# Patient Record
Sex: Male | Born: 2000 | Race: White | Hispanic: No | Marital: Single | Smoking: Current every day smoker
Health system: Southern US, Community
[De-identification: ages and names within clinical notes are randomized; demographics above are authoritative.]

## PROBLEM LIST (undated history)

## (undated) DIAGNOSIS — F909 Attention-deficit hyperactivity disorder, unspecified type: Secondary | ICD-10-CM

## (undated) DIAGNOSIS — N200 Calculus of kidney: Secondary | ICD-10-CM

## (undated) DIAGNOSIS — F419 Anxiety disorder, unspecified: Secondary | ICD-10-CM

## (undated) DIAGNOSIS — N289 Disorder of kidney and ureter, unspecified: Secondary | ICD-10-CM

## (undated) HISTORY — PX: TYMPANOSTOMY TUBE PLACEMENT: SHX32

## (undated) HISTORY — DX: Anxiety disorder, unspecified: F41.9

## (undated) HISTORY — PX: ADENOIDECTOMY: SUR15

## (undated) HISTORY — DX: Calculus of kidney: N20.0

## (undated) HISTORY — PX: TONSILLECTOMY: SUR1361

---
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
## 2020-01-01 ENCOUNTER — Encounter: Payer: Self-pay | Admitting: Emergency Medicine

## 2020-01-01 ENCOUNTER — Emergency Department: Payer: Medicaid Other

## 2020-01-01 ENCOUNTER — Emergency Department
Admission: EM | Admit: 2020-01-01 | Discharge: 2020-01-01 | Disposition: A | Discharge status: Home / Self Care | Payer: Medicaid Other | Attending: Emergency Medicine | Admitting: Emergency Medicine

## 2020-01-01 ENCOUNTER — Other Ambulatory Visit: Payer: Self-pay

## 2020-01-01 DIAGNOSIS — L03032 Cellulitis of left toe: Secondary | ICD-10-CM | POA: Diagnosis not present

## 2020-01-01 DIAGNOSIS — W19XXXA Unspecified fall, initial encounter: Secondary | ICD-10-CM | POA: Diagnosis not present

## 2020-01-01 DIAGNOSIS — Z79899 Other long term (current) drug therapy: Secondary | ICD-10-CM | POA: Diagnosis not present

## 2020-01-01 DIAGNOSIS — M79672 Pain in left foot: Secondary | ICD-10-CM | POA: Insufficient documentation

## 2020-01-01 DIAGNOSIS — M25561 Pain in right knee: Secondary | ICD-10-CM | POA: Insufficient documentation

## 2020-01-01 DIAGNOSIS — Y990 Civilian activity done for income or pay: Secondary | ICD-10-CM | POA: Diagnosis not present

## 2020-01-01 DIAGNOSIS — M25562 Pain in left knee: Secondary | ICD-10-CM | POA: Diagnosis not present

## 2020-01-01 DIAGNOSIS — M7989 Other specified soft tissue disorders: Secondary | ICD-10-CM | POA: Diagnosis not present

## 2020-01-01 DIAGNOSIS — Q741 Congenital malformation of knee: Secondary | ICD-10-CM | POA: Diagnosis not present

## 2020-01-01 HISTORY — DX: Disorder of kidney and ureter, unspecified: N28.9

## 2020-01-01 MED ORDER — MELOXICAM 15 MG PO TABS: 15.0000 mg | ORAL_TABLET | Freq: Every day | ORAL | 0 refills | Status: AC

## 2020-01-01 MED ORDER — CEPHALEXIN 500 MG PO CAPS: 1000.0000 mg | ORAL_CAPSULE | Freq: Two times a day (BID) | ORAL | 0 refills | Status: AC

## 2020-01-01 NOTE — ED Notes (Signed)
Radiology at bedside

## 2020-01-01 NOTE — ED Triage Notes (Signed)
Patient states that he fell on bilateral knees at work Thursday and that he continues to have pain. Patient states that he does not want to file workers comp. Patient states that he also has an infection by his toe nail on his left first toe. Patient also complaint of pain to the top of his left foot that started yesterday.

## 2020-01-01 NOTE — ED Provider Notes (Signed)
[facility] Emergency Department Provider Note  ____________________________________________   First MD Initiated Contact with Patient 01/01/20 2148     (approximate)  I have reviewed the triage vital signs and the nursing notes.   HISTORY  Chief Complaint Knee Pain and Wound Check  HPI Jesus Mason is a 19 y.o. male who presents emergency department for evaluation of bilateral knee pain, left foot pain and left toe pain.  The patient states that on Wednesday or Thursday of last week, the patient fell while working at [employer].  He states he does not want to claim this is a workers comp injury.  He states that he was walking with a Tea urn and slipped and fell with his knees flexed directly onto the hard floor.  He states he has history of bilateral knee pain for the last several years, but states that significantly worsened since the injury.  He does not have pain unless he is weightbearing, but weightbearing makes it worse, left is worse than right.  He denies any alleviating attempts.  He also states that he injured his left foot during the injury but he is unsure how it fell.  He complains of pain to the dorsum of the foot.  In addition, the patient states he has an infection of his left toe has been present for about a week and a half.  He states that he has self treated this before but that it has returned.  He says has not tried any alleviating attempts this time for his left toe.         Past Medical History:  Diagnosis Date  . ADHD (attention deficit hyperactivity disorder)   . Renal disorder    kidney stones    There are no problems to display for this patient.   Past Surgical History:  Procedure Laterality Date  . ADENOIDECTOMY    . TONSILLECTOMY    . TYMPANOSTOMY TUBE PLACEMENT      Prior to Admission medications   Medication Sig Start Date End Date Taking? Authorizing Provider  acetaminophen-codeine (TYLENOL #3) 300-30 MG  tablet Take 1 tablet by mouth every 6 (six) hours as needed for moderate pain. 01/13/15   Tommi Rumps, PA-C  busPIRone (BUSPAR) 10 MG tablet Take 1 tablet by mouth daily.  09/03/14   [provider]  butalbital-acetaminophen-caffeine (FIORICET) 50-325-40 MG tablet Take 1 tablet by mouth every 6 (six) hours as needed for headache. 05/14/19 05/13/20  Enid Derry, PA-C  cephALEXin (KEFLEX) 500 MG capsule Take 2 capsules (1,000 mg total) by mouth 2 (two) times daily for 10 days. 01/01/20 01/11/20  Lucy Chris, PA  dexmethylphenidate (FOCALIN XR) 15 MG 24 hr capsule Take 15 mg by mouth daily.    [provider]  dexmethylphenidate (FOCALIN) 10 MG tablet Take 10 mg by mouth daily.    [provider]  dexmethylphenidate (FOCALIN) 5 MG tablet Take 5 mg by mouth daily.    [provider]  diazepam (VALIUM) 2 MG tablet Take 1 tablet (2 mg total) by mouth every 8 (eight) hours as needed for muscle spasms. 01/13/15   Tommi Rumps, PA-C  HYDROcodone-acetaminophen (NORCO/VICODIN) 5-325 MG tablet Take 1 tablet by mouth every 6 (six) hours as needed for moderate pain. 09/29/19   Fisher, Roselyn Bering, PA-C  ketorolac (TORADOL) 10 MG tablet Take 1 tablet (10 mg total) by mouth every 6 (six) hours as needed. 09/29/19   Sherrie Mustache Roselyn Bering, PA-C  meloxicam [facility])  15 MG tablet Take 1 tablet (15 mg total) by mouth daily for 15 days. 01/01/20 01/16/20  Lucy Chrisodgers, Desare Duddy J, PA  ondansetron (ZOFRAN ODT) 4 MG disintegrating tablet Take 1 tablet (4 mg total) by mouth every 6 (six) hours as needed for nausea or vomiting. 09/30/14   Sharyn CreamerQuale, Mark, MD  traZODone (DESYREL) 100 MG tablet Take 100 mg by mouth at bedtime as needed for sleep.    [provider]    Allergies Patient has no known allergies.  No family history on file.  Social History Social History   Tobacco Use  . Smoking status: Never Smoker  . Smokeless tobacco: Never Used  Substance Use Topics  . Alcohol use: No   . Drug use: No    Review of Systems Constitutional: No fever/chills Eyes: No visual changes. ENT: No sore throat. Cardiovascular: Denies chest pain. Respiratory: Denies shortness of breath. Gastrointestinal: No abdominal pain.  No nausea, no vomiting.  No diarrhea.  No constipation. Genitourinary: Negative for dysuria. Musculoskeletal: + Bilateral knee pain, left foot pain, left great toe pain Skin: Negative for rash. Neurological: Negative for headaches, focal weakness or numbness.   ____________________________________________   PHYSICAL EXAM:  VITAL SIGNS: ED Triage Vitals [01/01/20 2125]  Enc Vitals Group     BP (!) 147/86     Pulse Rate 97     Resp 18     Temp 98.7 F (37.1 C)     Temp Source Oral     SpO2 97 %     Weight 210 lb (95.3 kg)     Height 5\' 8"  (1.727 m)     Head Circumference      Peak Flow      Pain Score 0     Pain Loc      Pain Edu?      Excl. in GC?     Constitutional: Alert and oriented. Well appearing and in no acute distress. Eyes: Conjunctivae are normal.  EOMI. Head: Atraumatic. Nose: No congestion/rhinnorhea. Mouth/Throat: Mucous membranes are moist.   Neck: No stridor.   Cardiovascular: Normal rate, regular rhythm.  Good peripheral circulation. Respiratory: Normal respiratory effort.  No retractions.  Musculoskeletal: There is no tenderness to palpation of the bilateral knees.  Patient has full and equal range of motion, and also is able to get to full extension.  Ligamentous exam is intact for LCL, MCL PCL bilaterally.  Negative McMurray bilaterally.  Patient has full range of motion of the left ankle, minimal tenderness to palpation of the dorsum of the left foot.  Dorsalis pedis 2+, capillary refill less than 3 seconds.  The patient does have a soft tissue swelling about the lateral nail bed on the left great toe with erythema and scabbing from site of previous drainage. Neurologic:  Normal speech and language. No gross focal  neurologic deficits are appreciated. No gait instability. Skin:  Skin is warm, dry and intact except as described above.  Psychiatric: Mood and affect are normal. Speech and behavior are normal.  ____________________________________________  RADIOLOGY I, Lucy Chrisaitlin J Taylynn Easton, personally viewed and evaluated these images (plain radiographs) as part of my medical decision making, as well as reviewing the written report by the radiologist.  ED provider interpretation: No acute fractures of the bilateral knees or left foot  Official radiology report(s): DG Knee Complete 4 Views Left  Result Date: 01/01/2020 CLINICAL DATA:  Fall to bilateral knees EXAM: LEFT KNEE - COMPLETE 4+ VIEW; RIGHT KNEE - COMPLETE 4+ VIEW COMPARISON:  Radiograph 11/19/2016 FINDINGS: Right knee: Minimal anterior soft tissue swelling. No sizable knee joint effusion. No acute bony abnormality. Specifically, no fracture, subluxation, or dislocation. Left knee: Corticated appearance of a bipartite patella. Mild soft tissue swelling anteriorly. No acute bony abnormality. Specifically, no fracture, subluxation, or dislocation. No sizeable joint effusion. IMPRESSION: 1. Minimal anterior soft tissue swelling bilaterally. No acute fracture or traumatic malalignment. No sizeable effusions. 2. Bipartite left patella. Electronically Signed   By: Kreg Shropshire M.D.   On: 01/01/2020 21:53   DG Knee Complete 4 Views Right  Result Date: 01/01/2020 CLINICAL DATA:  Fall to bilateral knees EXAM: LEFT KNEE - COMPLETE 4+ VIEW; RIGHT KNEE - COMPLETE 4+ VIEW COMPARISON:  Radiograph 11/19/2016 FINDINGS: Right knee: Minimal anterior soft tissue swelling. No sizable knee joint effusion. No acute bony abnormality. Specifically, no fracture, subluxation, or dislocation. Left knee: Corticated appearance of a bipartite patella. Mild soft tissue swelling anteriorly. No acute bony abnormality. Specifically, no fracture, subluxation, or dislocation. No sizeable joint  effusion. IMPRESSION: 1. Minimal anterior soft tissue swelling bilaterally. No acute fracture or traumatic malalignment. No sizeable effusions. 2. Bipartite left patella. Electronically Signed   By: Kreg Shropshire M.D.   On: 01/01/2020 21:53   DG Foot Complete Left  Result Date: 01/01/2020 CLINICAL DATA:  Left foot pain after fall EXAM: LEFT FOOT - COMPLETE 3+ VIEW COMPARISON:  None. FINDINGS: There is no evidence of fracture or dislocation. There is no evidence of arthropathy or other focal bone abnormality. Mild dorsal soft tissue swelling. IMPRESSION: Negative. Electronically Signed   By: Jonna Clark M.D.   On: 01/01/2020 22:26    ____________________________________________   INITIAL IMPRESSION / ASSESSMENT AND PLAN / ED COURSE  As part of my medical decision making, I reviewed the following data within the electronic MEDICAL RECORD NUMBER Nursing notes reviewed and incorporated and Radiograph reviewed         Patient is a 19 year old male who presents emergency department for evaluation of bilateral knee pain, left foot pain and left great toe pain. Physical exam is grossly benign and does not demonstrate any instability of either knee. X-rays are negative for any acute fractures of the bilateral knees or left foot. Exam of the left great toe is consistent with paronychia, however there is already a site where it has tried to drain on its own. It does not appear to have any collected pus at the site currently, this is consistent with paronychia for the left great toe. We will place the patient on anti-inflammatories for the bilateral knee pain and left foot pain due to recent fall and will prescribe patient Keflex for his paronychia. We will have the patient follow-up with primary care for his multiple complaints however did provide information for podiatry should he want his ingrown nail taking care of. Patient is stable at this time for outpatient therapy and will return to the emergency department  with any acute worsening.      ____________________________________________   FINAL CLINICAL IMPRESSION(S) / ED DIAGNOSES  Final diagnoses:  Acute pain of both knees  Left foot pain  Paronychia of great toe of left foot     ED Discharge Orders         Ordered    cephALEXin (KEFLEX) 500 MG capsule  2 times daily        01/01/20 2239    meloxicam (MOBIC) 15 MG tablet  Daily        01/01/20 2239          *  Please note:  Jesus Mason was evaluated in Emergency Department on 01/01/2020 for the symptoms described in the history of present illness. He was evaluated in the context of the global COVID-19 pandemic, which necessitated consideration that the patient might be at risk for infection with the SARS-CoV-2 virus that causes COVID-19. Institutional protocols and algorithms that pertain to the evaluation of patients at risk for COVID-19 are in a state of rapid change based on information released by regulatory bodies including the CDC and federal and state organizations. These policies and algorithms were followed during the patient's care in the ED.  Some ED evaluations and interventions may be delayed as a result of limited staffing during and the pandemic.*   Note:  This document was prepared using Dragon voice recognition software and may include unintentional dictation errors.    Lucy Chris, PA 01/01/20 Clinton Sawyer    Shaune Pollack, MD 01/02/20 1248

## 2020-01-29 DIAGNOSIS — Z419 Encounter for procedure for purposes other than remedying health state, unspecified: Secondary | ICD-10-CM | POA: Diagnosis not present

## 2020-02-17 ENCOUNTER — Other Ambulatory Visit: Payer: Medicaid Other

## 2020-02-17 DIAGNOSIS — Z20822 Contact with and (suspected) exposure to covid-19: Secondary | ICD-10-CM | POA: Diagnosis not present

## 2020-02-18 LAB — NOVEL CORONAVIRUS, NAA: SARS-CoV-2, NAA: NOT DETECTED

## 2020-02-18 LAB — SARS-COV-2, NAA 2 DAY TAT

## 2020-02-21 ENCOUNTER — Other Ambulatory Visit: Payer: Medicaid Other

## 2020-02-21 DIAGNOSIS — Z20822 Contact with and (suspected) exposure to covid-19: Secondary | ICD-10-CM

## 2020-02-22 LAB — NOVEL CORONAVIRUS, NAA: SARS-CoV-2, NAA: DETECTED — AB

## 2020-02-22 LAB — SARS-COV-2, NAA 2 DAY TAT

## 2020-02-29 DIAGNOSIS — Z419 Encounter for procedure for purposes other than remedying health state, unspecified: Secondary | ICD-10-CM | POA: Diagnosis not present

## 2020-03-25 ENCOUNTER — Emergency Department: Payer: Worker's Compensation

## 2020-03-25 ENCOUNTER — Other Ambulatory Visit: Payer: Self-pay

## 2020-03-25 ENCOUNTER — Emergency Department
Admission: EM | Admit: 2020-03-25 | Discharge: 2020-03-25 | Disposition: A | Discharge status: Home / Self Care | Payer: Worker's Compensation | Attending: Emergency Medicine | Admitting: Emergency Medicine

## 2020-03-25 DIAGNOSIS — Y990 Civilian activity done for income or pay: Secondary | ICD-10-CM | POA: Insufficient documentation

## 2020-03-25 DIAGNOSIS — S93492A Sprain of other ligament of left ankle, initial encounter: Secondary | ICD-10-CM | POA: Insufficient documentation

## 2020-03-25 DIAGNOSIS — W010XXA Fall on same level from slipping, tripping and stumbling without subsequent striking against object, initial encounter: Secondary | ICD-10-CM | POA: Insufficient documentation

## 2020-03-25 DIAGNOSIS — S99912A Unspecified injury of left ankle, initial encounter: Secondary | ICD-10-CM | POA: Diagnosis present

## 2020-03-25 MED ORDER — MELOXICAM 7.5 MG PO TABS
15.0000 mg | ORAL_TABLET | Freq: Once | ORAL | Status: AC
  Administered 2020-03-25: 15 mg via ORAL
  Filled 2020-03-25: qty 2

## 2020-03-25 MED ORDER — MELOXICAM 15 MG PO TABS: 15.0000 mg | ORAL_TABLET | Freq: Every day | ORAL | 0 refills | Status: AC

## 2020-03-25 NOTE — ED Notes (Signed)
Workers Comp unable to be completed due to Pt not having ID.

## 2020-03-25 NOTE — ED Triage Notes (Addendum)
Pt presents the ED via POV from work. Pt states he had a slip and trip on some rock while he was working at [employer]. Pt states that he only stumbled and did fall all the way. Pt c/o L ankle pain. Pt is A&Ox4 and NAD.   Pt is a WC patient.

## 2020-03-25 NOTE — ED Notes (Signed)
Pt's mother to pick up pt in an hour due to mother being at work. Pt wheeled to lobby to wait

## 2020-03-26 NOTE — ED Provider Notes (Signed)
[facility] Emergency Department Provider Note  ____________________________________________   Event Date/Time   First MD Initiated Contact with Patient 03/25/20 1702     (approximate)  I have reviewed the triage vital signs and the nursing notes.   HISTORY  Chief Complaint Ankle Pain  HPI Jesus Mason is a 20 y.o. male who presents to the emergency department for evaluation of left ankle pain.  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  He denies hearing a pop, has been able to ambulate on it since that time.  Pain is mostly located in the lateral soft tissue region.  He has not tried any alleviating measures at this time.         Past Medical History:  Diagnosis Date  . ADHD (attention deficit hyperactivity disorder)   . Renal disorder    kidney stones    There are no problems to display for this patient.   Past Surgical History:  Procedure Laterality Date  . ADENOIDECTOMY    . TONSILLECTOMY    . TYMPANOSTOMY TUBE PLACEMENT      Prior to Admission medications   Medication Sig Start Date End Date Taking? Authorizing Provider  meloxicam (MOBIC) 15 MG tablet Take 1 tablet (15 mg total) by mouth daily for 15 days. 03/25/20 04/09/20 Yes Rodgers, Ruben Gottron, PA  acetaminophen-codeine (TYLENOL #3) 300-30 MG tablet Take 1 tablet by mouth every 6 (six) hours as needed for moderate pain. 01/13/15   Tommi Rumps, PA-C  busPIRone (BUSPAR) 10 MG tablet Take 1 tablet by mouth daily.  09/03/14   [provider]  butalbital-acetaminophen-caffeine (FIORICET) 50-325-40 MG tablet Take 1 tablet by mouth every 6 (six) hours as needed for headache. 05/14/19 05/13/20  Enid Derry, PA-C  dexmethylphenidate (FOCALIN XR) 15 MG 24 hr capsule Take 15 mg by mouth daily.    [provider]  dexmethylphenidate (FOCALIN) 10 MG  tablet Take 10 mg by mouth daily.    [provider]  dexmethylphenidate (FOCALIN) 5 MG tablet Take 5 mg by mouth daily.    [provider]  diazepam (VALIUM) 2 MG tablet Take 1 tablet (2 mg total) by mouth every 8 (eight) hours as needed for muscle spasms. 01/13/15   Tommi Rumps, PA-C  HYDROcodone-acetaminophen (NORCO/VICODIN) 5-325 MG tablet Take 1 tablet by mouth every 6 (six) hours as needed for moderate pain. 09/29/19   Fisher, Roselyn Bering, PA-C  ketorolac (TORADOL) 10 MG tablet Take 1 tablet (10 mg total) by mouth every 6 (six) hours as needed. 09/29/19   Fisher, Roselyn Bering, PA-C  ondansetron (ZOFRAN ODT) 4 MG disintegrating tablet Take 1 tablet (4 mg total) by mouth every 6 (six) hours as needed for nausea or vomiting. 09/30/14   Sharyn Creamer, MD  traZODone (DESYREL) 100 MG tablet Take 100 mg by mouth at bedtime as needed for sleep.    [provider]    Allergies Patient has no known allergies.  No family history on file.  Social History Social History   Tobacco Use  . Smoking status: Never Smoker  . Smokeless tobacco: Never Used  Substance Use Topics  . Alcohol use: No  . Drug use: No    Review of Systems Constitutional: No fever/chills Eyes: No visual changes. ENT: No sore throat. Cardiovascular: Denies chest pain. Respiratory: Denies shortness of breath. Gastrointestinal: No abdominal  pain.  No nausea, no vomiting.  No diarrhea.  No constipation. Genitourinary: Negative for dysuria. Musculoskeletal: + Left ankle pain, negative for back pain. Skin: Negative for rash. Neurological: Negative for headaches, focal weakness or numbness.   ____________________________________________   PHYSICAL EXAM:  VITAL SIGNS: ED Triage Vitals  Enc Vitals Group     BP 03/25/20 1543 (!) 156/86     Pulse Rate 03/25/20 1543 91     Resp 03/25/20 1543 18     Temp 03/25/20 1543 97.7 F (36.5 C)     Temp Source 03/25/20 1543 Oral     SpO2 03/25/20 1543 100 %      Weight 03/25/20 1536 205 lb (93 kg)     Height 03/25/20 1536 5\' 8"  (1.727 m)     Head Circumference --      Peak Flow --      Pain Score 03/25/20 1536 7     Pain Loc --      Pain Edu? --      Excl. in GC? --    Constitutional: Alert and oriented. Well appearing and in no acute distress. Eyes: Conjunctivae are normal. PERRL. EOMI. Head: Atraumatic. Nose: No congestion/rhinnorhea. Mouth/Throat: Mucous membranes are moist.   Neck: No stridor.   Musculoskeletal: There is tenderness noted to the lateral aspect of the left ankle.  No bony tenderness of the distal fibula or proximal fifth metatarsal.  There is no ecchymosis or significant soft tissue swelling.  Dorsal pedal pulse 2+, capillary refill less than 3 seconds.  Patient able to move ankle in all directions, with mild pain at end range.  Maintains 5/5 strength in plantarflexion and dorsiflexion. Neurologic:  Normal speech and language. No gross focal neurologic deficits are appreciated. No gait instability. Skin:  Skin is warm, dry and intact. No rash noted. Psychiatric: Mood and affect are normal. Speech and behavior are normal.   ____________________________________________  RADIOLOGY I, 03/27/20, personally viewed and evaluated these images (plain radiographs) as part of my medical decision making, as well as reviewing the written report by the radiologist.  ED provider interpretation: No acute fracture identified  Official radiology report(s): DG Ankle Complete Left  Result Date: 03/25/2020 CLINICAL DATA:  Ankle pain EXAM: LEFT ANKLE COMPLETE - 3+ VIEW COMPARISON:  January 01, 2020 FINDINGS: No acute fracture or dislocation. Joint spaces and alignment are maintained. No area of erosion or osseous destruction. No unexpected radiopaque foreign body. Soft tissues are unremarkable. IMPRESSION: No acute fracture or dislocation. Electronically Signed   By: January 03, 2020 MD   On: 03/25/2020 16:14     ____________________________________________   INITIAL IMPRESSION / ASSESSMENT AND PLAN / ED COURSE  As part of my medical decision making, I reviewed the following data within the electronic MEDICAL RECORD NUMBER Nursing notes reviewed and incorporated, Radiograph reviewed and Notes from prior ED visits        Patient is a 20 year old male who presents to the emergency department for evaluation of left ankle pain.  See HPI for further details.  In triage, the patient has normal vital signs.  On physical exam, there is tenderness over the lateral soft tissue structures, no bony tenderness of the distal fibula or proximal fifth metatarsal.  Patient is neurovascularly intact and maintains a 5/5 strength in plantar flexion dorsiflexion.  Patient's history and physical exam most consistent with mild ankle sprain.  Will initiate treatment with ASO brace, anti-inflammatories and follow-up with orthopedics.  Patient is amenable with this plan  and stable at this time for outpatient follow-up.      ____________________________________________   FINAL CLINICAL IMPRESSION(S) / ED DIAGNOSES  Final diagnoses:  Sprain of other ligament of left ankle, initial encounter     ED Discharge Orders         Ordered    meloxicam (MOBIC) 15 MG tablet  Daily        03/25/20 1823          *Please note:  Jesus Mason was evaluated in Emergency Department on 03/26/2020 for the symptoms described in the history of present illness. He was evaluated in the context of the global COVID-19 pandemic, which necessitated consideration that the patient might be at risk for infection with the SARS-CoV-2 virus that causes COVID-19. Institutional protocols and algorithms that pertain to the evaluation of patients at risk for COVID-19 are in a state of rapid change based on information released by regulatory bodies including the CDC and federal and state organizations. These policies and algorithms were followed  during the patient's care in the ED.  Some ED evaluations and interventions may be delayed as a result of limited staffing during and the pandemic.*   Note:  This document was prepared using Dragon voice recognition software and may include unintentional dictation errors.   Lucy Chris, PA 03/26/20 1404    Phineas Semen, MD 03/26/20 [phone]

## 2020-03-28 DIAGNOSIS — Z419 Encounter for procedure for purposes other than remedying health state, unspecified: Secondary | ICD-10-CM | POA: Diagnosis not present

## 2020-04-28 DIAGNOSIS — Z419 Encounter for procedure for purposes other than remedying health state, unspecified: Secondary | ICD-10-CM | POA: Diagnosis not present

## 2020-05-28 DIAGNOSIS — Z419 Encounter for procedure for purposes other than remedying health state, unspecified: Secondary | ICD-10-CM | POA: Diagnosis not present

## 2020-06-28 DIAGNOSIS — Z419 Encounter for procedure for purposes other than remedying health state, unspecified: Secondary | ICD-10-CM | POA: Diagnosis not present

## 2020-07-28 DIAGNOSIS — Z419 Encounter for procedure for purposes other than remedying health state, unspecified: Secondary | ICD-10-CM | POA: Diagnosis not present

## 2020-08-28 DIAGNOSIS — Z419 Encounter for procedure for purposes other than remedying health state, unspecified: Secondary | ICD-10-CM | POA: Diagnosis not present

## 2020-09-20 ENCOUNTER — Emergency Department: Payer: No Typology Code available for payment source

## 2020-09-20 ENCOUNTER — Emergency Department
Admission: EM | Admit: 2020-09-20 | Discharge: 2020-09-21 | Disposition: A | Discharge status: Home / Self Care | Payer: No Typology Code available for payment source | Attending: Emergency Medicine | Admitting: Emergency Medicine

## 2020-09-20 ENCOUNTER — Other Ambulatory Visit: Payer: Self-pay

## 2020-09-20 ENCOUNTER — Encounter: Payer: Self-pay | Admitting: Emergency Medicine

## 2020-09-20 DIAGNOSIS — S300XXA Contusion of lower back and pelvis, initial encounter: Secondary | ICD-10-CM | POA: Insufficient documentation

## 2020-09-20 DIAGNOSIS — W010XXA Fall on same level from slipping, tripping and stumbling without subsequent striking against object, initial encounter: Secondary | ICD-10-CM | POA: Diagnosis not present

## 2020-09-20 DIAGNOSIS — Y990 Civilian activity done for income or pay: Secondary | ICD-10-CM | POA: Diagnosis not present

## 2020-09-20 DIAGNOSIS — W19XXXA Unspecified fall, initial encounter: Secondary | ICD-10-CM

## 2020-09-20 DIAGNOSIS — S39012A Strain of muscle, fascia and tendon of lower back, initial encounter: Secondary | ICD-10-CM | POA: Insufficient documentation

## 2020-09-20 DIAGNOSIS — S3992XA Unspecified injury of lower back, initial encounter: Secondary | ICD-10-CM | POA: Diagnosis present

## 2020-09-20 DIAGNOSIS — M545 Low back pain, unspecified: Secondary | ICD-10-CM | POA: Diagnosis not present

## 2020-09-20 DIAGNOSIS — M533 Sacrococcygeal disorders, not elsewhere classified: Secondary | ICD-10-CM | POA: Diagnosis not present

## 2020-09-20 DIAGNOSIS — Y9289 Other specified places as the place of occurrence of the external cause: Secondary | ICD-10-CM | POA: Diagnosis not present

## 2020-09-20 MED ORDER — ORPHENADRINE CITRATE 30 MG/ML IJ SOLN
60.0000 mg | Freq: Once | INTRAMUSCULAR | Status: AC
  Administered 2020-09-20: 60 mg via INTRAMUSCULAR
  Filled 2020-09-20: qty 2

## 2020-09-20 MED ORDER — METHOCARBAMOL 500 MG PO TABS: 500.0000 mg | ORAL_TABLET | Freq: Four times a day (QID) | ORAL | 0 refills | Status: DC

## 2020-09-20 MED ORDER — MELOXICAM 15 MG PO TABS: 15.0000 mg | ORAL_TABLET | Freq: Every day | ORAL | 0 refills | Status: DC

## 2020-09-20 MED ORDER — KETOROLAC TROMETHAMINE 30 MG/ML IJ SOLN
30.0000 mg | Freq: Once | INTRAMUSCULAR | Status: AC
  Administered 2020-09-20: 30 mg via INTRAMUSCULAR
  Filled 2020-09-20: qty 1

## 2020-09-20 NOTE — ED Provider Notes (Signed)
[facility] Emergency Department Provider Note  ____________________________________________  Time seen: Approximately 11:30 PM  I have reviewed the triage vital signs and the nursing notes.   HISTORY  Chief Complaint Fall    HPI Jesus Mason is a 20 y.o. male who presents the emergency department complaining of low back and tailbone pain after slipping and falling at work.  Patient fell backwards landing on his back and buttocks area.  He states that he hit his head but did not lose consciousness and has had no headaches or concerning symptoms such as vision changes or neck pain.  Patient states that initially he had some numbness and tingling running down his legs directly after the incident but this has resolved.  No bowel or bladder dysfunction, saddle anesthesia or paresthesias.  No history of previous back issues.  Patient is primarily concerned given the ongoing coccyx pain       Past Medical History:  Diagnosis Date   ADHD (attention deficit hyperactivity disorder)    Renal disorder    kidney stones    There are no problems to display for this patient.   Past Surgical History:  Procedure Laterality Date   ADENOIDECTOMY     TONSILLECTOMY     TYMPANOSTOMY TUBE PLACEMENT      Prior to Admission medications   Medication Sig Start Date End Date Taking? Authorizing Provider  acetaminophen-codeine (TYLENOL #3) 300-30 MG tablet Take 1 tablet by mouth every 6 (six) hours as needed for moderate pain. 01/13/15   Tommi Rumps, PA-C  busPIRone (BUSPAR) 10 MG tablet Take 1 tablet by mouth daily.  09/03/14   [provider]  dexmethylphenidate (FOCALIN XR) 15 MG 24 hr capsule Take 15 mg by mouth daily.    [provider]  dexmethylphenidate (FOCALIN) 10 MG tablet Take 10 mg by mouth daily.    [provider]  dexmethylphenidate (FOCALIN) 5 MG tablet Take 5 mg by mouth daily.    [provider]  diazepam  (VALIUM) 2 MG tablet Take 1 tablet (2 mg total) by mouth every 8 (eight) hours as needed for muscle spasms. 01/13/15   Tommi Rumps, PA-C  HYDROcodone-acetaminophen (NORCO/VICODIN) 5-325 MG tablet Take 1 tablet by mouth every 6 (six) hours as needed for moderate pain. 09/29/19   Fisher, Roselyn Bering, PA-C  ketorolac (TORADOL) 10 MG tablet Take 1 tablet (10 mg total) by mouth every 6 (six) hours as needed. 09/29/19   Fisher, Roselyn Bering, PA-C  ondansetron (ZOFRAN ODT) 4 MG disintegrating tablet Take 1 tablet (4 mg total) by mouth every 6 (six) hours as needed for nausea or vomiting. 09/30/14   Sharyn Creamer, MD  traZODone (DESYREL) 100 MG tablet Take 100 mg by mouth at bedtime as needed for sleep.    [provider]    Allergies Patient has no known allergies.  No family history on file.  Social History Social History   Tobacco Use   Smoking status: Never   Smokeless tobacco: Never  Vaping Use   Vaping Use: Never used  Substance Use Topics   Alcohol use: No   Drug use: No     Review of Systems  Constitutional: No fever/chills Eyes: No visual changes. No discharge ENT: No upper respiratory complaints. Cardiovascular: no chest pain. Respiratory: no cough. No SOB. Gastrointestinal: No abdominal pain.  No nausea, no vomiting.  No diarrhea.  No constipation. Genitourinary: Negative for dysuria. No hematuria Musculoskeletal: Positive for lower back and tailbone pain  Skin: Negative for rash, abrasions, lacerations, ecchymosis. Neurological: Negative for headaches, focal weakness or numbness.  10 System ROS otherwise negative.  ____________________________________________   PHYSICAL EXAM:  VITAL SIGNS: ED Triage Vitals  Enc Vitals Group     BP 09/20/20 2117 (!) 133/93     Pulse Rate 09/20/20 2117 98     Resp 09/20/20 2117 15     Temp 09/20/20 2117 98.5 F (36.9 C)     Temp Source 09/20/20 2117 Oral     SpO2 09/20/20 2117 98 %     Weight 09/20/20 2119 220 lb (99.8 kg)      Height 09/20/20 2119 5\' 8"  (1.727 m)     Head Circumference --      Peak Flow --      Pain Score 09/20/20 2118 3     Pain Loc --      Pain Edu? --      Excl. in GC? --      Constitutional: Alert and oriented. Well appearing and in no acute distress. Eyes: Conjunctivae are normal. PERRL. EOMI. Head: Atraumatic. ENT:      Ears:       Nose: No congestion/rhinnorhea.      Mouth/Throat: Mucous membranes are moist.  Neck: No stridor.  No cervical spine tenderness to palpation.  Cardiovascular: Normal rate, regular rhythm. Normal S1 and S2.  Good peripheral circulation. Respiratory: Normal respiratory effort without tachypnea or retractions. Lungs CTAB. Good air entry to the bases with no decreased or absent breath sounds. Musculoskeletal: Full range of motion to all extremities. No gross deformities appreciated.  Visualization of the lower back and tailbone region revealed no obvious signs of trauma.  Patient has good range of motion at this time.  Tender through the lower back extending into the sacral and coccygeal region.  No palpable abnormalities or step-off.  No tenderness over bilateral sciatic notches.  Negative straight leg raise bilaterally.  Dorsalis pedis pulses sensation intact ankle bilateral lower extremities. Neurologic:  Normal speech and language. No gross focal neurologic deficits are appreciated.  Skin:  Skin is warm, dry and intact. No rash noted. Psychiatric: Mood and affect are normal. Speech and behavior are normal. Patient exhibits appropriate insight and judgement.   ____________________________________________   LABS (all labs ordered are listed, but only abnormal results are displayed)  Labs Reviewed - No data to display ____________________________________________  EKG   ____________________________________________  RADIOLOGY I personally viewed and evaluated these images as part of my medical decision making, as well as reviewing the written report by  the radiologist.  ED Provider Interpretation: No evidence of fracture on lumbar or coccyx films  DG Lumbar Spine 2-3 Views  Result Date: 09/20/2020 CLINICAL DATA:  09/22/2020, lower back and coccygeal pain EXAM: SACRUM AND COCCYX - 2+ VIEW; LUMBAR SPINE - 2-3 VIEW COMPARISON:  None. FINDINGS: Lumbar spine: Frontal and lateral views demonstrate 5 non-rib-bearing lumbar type vertebral bodies in anatomic alignment. No acute fractures. Disc spaces are well preserved. Sacroiliac joints are normal. Sacrum and coccyx: Frontal and lateral views of the sacrum and coccyx are obtained. No acute displaced fractures. Alignment is anatomic. Visualized portions of the bony pelvis are normal. IMPRESSION: 1. Unremarkable lumbar spine, sacrum, and coccyx. Electronically Signed   By: Larey Seat M.D.   On: 09/20/2020 22:19   DG Sacrum/Coccyx  Result Date: 09/20/2020 CLINICAL DATA:  09/22/2020, lower back and coccygeal pain EXAM: SACRUM AND COCCYX - 2+ VIEW; LUMBAR SPINE - 2-3 VIEW COMPARISON:  None.  FINDINGS: Lumbar spine: Frontal and lateral views demonstrate 5 non-rib-bearing lumbar type vertebral bodies in anatomic alignment. No acute fractures. Disc spaces are well preserved. Sacroiliac joints are normal. Sacrum and coccyx: Frontal and lateral views of the sacrum and coccyx are obtained. No acute displaced fractures. Alignment is anatomic. Visualized portions of the bony pelvis are normal. IMPRESSION: 1. Unremarkable lumbar spine, sacrum, and coccyx. Electronically Signed   By: Sharlet Salina M.D.   On: 09/20/2020 22:19    ____________________________________________    PROCEDURES  Procedure(s) performed:    Procedures    Medications - No data to display   ____________________________________________   INITIAL IMPRESSION / ASSESSMENT AND PLAN / ED COURSE  Pertinent labs & imaging results that were available during my care of the patient were reviewed by me and considered in my medical decision making (see  chart for details).  Review of the [place] CSRS was performed in accordance of the [facility] prior to dispensing any controlled drugs.           Patient's diagnosis is consistent with contusion of the coccyx with lumbar paraspinal muscle spasms.  Patient presents the emergency department after slipping and falling at work.  He landed on his buttocks and back.  This occurred 2 days ago but he was complaining of ongoing tailbone pain.  Imaging is reassuring with no evidence of fracture.  Patient is neurologically intact.  He did hit his head at the time but did not lose consciousness and has had no concerning symptoms such as headache or vision changes since.  No neck pain.  Imaging was concentrated to the lower back and coccyx which again reveals no acute traumatic findings.  Patient will have symptom control medication.  Follow-up with primary care as needed. Patient is given ED precautions to return to the ED for any worsening or new symptoms.     ____________________________________________  FINAL CLINICAL IMPRESSION(S) / ED DIAGNOSES  Final diagnoses:  Fall, initial encounter  Contusion of coccyx, initial encounter  Lumbosacral strain, initial encounter      NEW MEDICATIONS STARTED DURING THIS VISIT:  ED Discharge Orders     None           This chart was dictated using voice recognition software/Dragon. Despite best efforts to proofread, errors can occur which can change the meaning. Any change was purely unintentional.    Racheal Patches, PA-C 09/20/20 2337    Georga Hacking, MD 09/21/20 [phone]

## 2020-09-20 NOTE — ED Triage Notes (Signed)
Pt in via POV, reports falling at work on Monday, landing hard on buttocks.  Since with pain to tailbone and lower back.  Ambulatory to triage, NAD noted at this time.  [facility] profile printed and placed in chart.

## 2020-09-28 DIAGNOSIS — Z419 Encounter for procedure for purposes other than remedying health state, unspecified: Secondary | ICD-10-CM | POA: Diagnosis not present

## 2020-10-28 DIAGNOSIS — Z419 Encounter for procedure for purposes other than remedying health state, unspecified: Secondary | ICD-10-CM | POA: Diagnosis not present

## 2020-11-28 DIAGNOSIS — Z419 Encounter for procedure for purposes other than remedying health state, unspecified: Secondary | ICD-10-CM | POA: Diagnosis not present

## 2020-12-28 DIAGNOSIS — Z419 Encounter for procedure for purposes other than remedying health state, unspecified: Secondary | ICD-10-CM | POA: Diagnosis not present

## 2021-01-28 DIAGNOSIS — Z419 Encounter for procedure for purposes other than remedying health state, unspecified: Secondary | ICD-10-CM | POA: Diagnosis not present

## 2021-02-10 ENCOUNTER — Encounter: Payer: Self-pay | Admitting: Emergency Medicine

## 2021-02-10 ENCOUNTER — Emergency Department
Admission: EM | Admit: 2021-02-10 | Discharge: 2021-02-11 | Disposition: A | Discharge status: Home / Self Care | Payer: Medicaid Other | Attending: Emergency Medicine | Admitting: Emergency Medicine

## 2021-02-10 ENCOUNTER — Other Ambulatory Visit: Payer: Self-pay

## 2021-02-10 DIAGNOSIS — R519 Headache, unspecified: Secondary | ICD-10-CM | POA: Diagnosis present

## 2021-02-10 DIAGNOSIS — Z79899 Other long term (current) drug therapy: Secondary | ICD-10-CM | POA: Diagnosis not present

## 2021-02-10 DIAGNOSIS — G43909 Migraine, unspecified, not intractable, without status migrainosus: Secondary | ICD-10-CM | POA: Diagnosis not present

## 2021-02-10 MED ORDER — SODIUM CHLORIDE 0.9 % IV BOLUS
1000.0000 mL | Freq: Once | INTRAVENOUS | Status: AC
  Administered 2021-02-10: 1000 mL via INTRAVENOUS

## 2021-02-10 MED ORDER — DIPHENHYDRAMINE HCL 50 MG/ML IJ SOLN
50.0000 mg | Freq: Once | INTRAMUSCULAR | Status: AC
  Administered 2021-02-10: 50 mg via INTRAVENOUS
  Filled 2021-02-10: qty 1

## 2021-02-10 MED ORDER — METOCLOPRAMIDE HCL 5 MG/ML IJ SOLN
10.0000 mg | Freq: Once | INTRAMUSCULAR | Status: AC
  Administered 2021-02-10: 10 mg via INTRAVENOUS
  Filled 2021-02-10: qty 2

## 2021-02-10 MED ORDER — KETOROLAC TROMETHAMINE 30 MG/ML IJ SOLN
30.0000 mg | Freq: Once | INTRAMUSCULAR | Status: AC
  Administered 2021-02-10: 30 mg via INTRAVENOUS
  Filled 2021-02-10: qty 1

## 2021-02-10 NOTE — Discharge Instructions (Signed)
You have been seen today in the emergency room for migraine headache.  You were treated with Reglan, Toradol, and Benadryl.  Some of these medications can cause you to be sleepy.  Please be mindful of this for the next 6 to 8 hours.  Please try to stay hydrated as this can help you to remain headache free.  If you start to have a headache again please take your normal over-the-counter medications for migraine treatment.  You may return to the emergency room if needed or to a [place] [facility] urgent care.

## 2021-02-10 NOTE — ED Provider Notes (Signed)
[facility] Emergency Department Provider Note   ____________________________________________   Event Date/Time   First MD Initiated Contact with Patient 02/10/21 2136     (approximate)  I have reviewed the triage vital signs and the nursing notes.   HISTORY  Chief Complaint Migraine    HPI Jesus Mason is a 21 y.o. male patient presents to the emergency room with complaint of "migraine" headache for the past 3 days.  Patient states that he does have a history of migraine headaches and normally will take ibuprofen, Tylenol, Imitrex with good relief of symptoms.  He has tried this treatment without relief of symptoms for the past 3 days.  He has had to miss work today and is presenting to the emergency room for relief of symptoms.  He reports that his headache today is typical of his migraine pattern and is a 8 out of 10 at present time.  He describes the pain as a throbbing ache that is constant in nature.  He denies any nausea or vomiting.  He denies sensitivity to light or sound. I rechecked patient's blood pressure while in the room and it was 158/92 with heart rate of 97  Past Medical History:  Diagnosis Date   ADHD (attention deficit hyperactivity disorder)    Renal disorder    kidney stones    There are no problems to display for this patient.   Past Surgical History:  Procedure Laterality Date   ADENOIDECTOMY     TONSILLECTOMY     TYMPANOSTOMY TUBE PLACEMENT      Prior to Admission medications   Medication Sig Start Date End Date Taking? Authorizing Provider  acetaminophen-codeine (TYLENOL #3) 300-30 MG tablet Take 1 tablet by mouth every 6 (six) hours as needed for moderate pain. 01/13/15   Tommi RumpsSummers, Rhonda L, PA-C  busPIRone (BUSPAR) 10 MG tablet Take 1 tablet by mouth daily.  09/03/14   [provider]  dexmethylphenidate (FOCALIN XR) 15 MG 24 hr capsule Take 15 mg by mouth daily.    [provider]   dexmethylphenidate (FOCALIN) 10 MG tablet Take 10 mg by mouth daily.    [provider]  dexmethylphenidate (FOCALIN) 5 MG tablet Take 5 mg by mouth daily.    [provider]  diazepam (VALIUM) 2 MG tablet Take 1 tablet (2 mg total) by mouth every 8 (eight) hours as needed for muscle spasms. 01/13/15   Tommi RumpsSummers, Rhonda L, PA-C  HYDROcodone-acetaminophen (NORCO/VICODIN) 5-325 MG tablet Take 1 tablet by mouth every 6 (six) hours as needed for moderate pain. 09/29/19   Fisher, Roselyn BeringSusan W, PA-C  ketorolac (TORADOL) 10 MG tablet Take 1 tablet (10 mg total) by mouth every 6 (six) hours as needed. 09/29/19   Fisher, Roselyn BeringSusan W, PA-C  meloxicam (MOBIC) 15 MG tablet Take 1 tablet (15 mg total) by mouth daily. 09/20/20   Cuthriell, Delorise RoyalsJonathan D, PA-C  methocarbamol (ROBAXIN) 500 MG tablet Take 1 tablet (500 mg total) by mouth 4 (four) times daily. 09/20/20   Cuthriell, Delorise RoyalsJonathan D, PA-C  ondansetron (ZOFRAN ODT) 4 MG disintegrating tablet Take 1 tablet (4 mg total) by mouth every 6 (six) hours as needed for nausea or vomiting. 09/30/14   Sharyn CreamerQuale, Mark, MD  traZODone (DESYREL) 100 MG tablet Take 100 mg by mouth at bedtime as needed for sleep.    [provider]    Allergies Patient has no known allergies.  No family history on file.  Social History Social History   Tobacco  Use   Smoking status: Never   Smokeless tobacco: Never  Vaping Use   Vaping Use: Never used  Substance Use Topics   Alcohol use: No   Drug use: No    Review of Systems  Constitutional: No fever/chills Eyes: No visual changes. ENT: No sore throat. Cardiovascular: Denies chest pain. Respiratory: Denies shortness of breath. Gastrointestinal: No abdominal pain.  No nausea, no vomiting.  No diarrhea.  No constipation. Genitourinary: Negative for dysuria. Musculoskeletal: Negative for back pain. Skin: Negative for rash. Neurological: Positive for  headache.   ____________________________________________   PHYSICAL EXAM:  VITAL SIGNS: ED Triage Vitals [02/10/21 2134]  Enc Vitals Group     BP (!) 170/101     Pulse Rate (!) 104     Resp 18     Temp 98.2 F (36.8 C)     Temp src      SpO2 96 %     Weight 220 lb 0.3 oz (99.8 kg)     Height 5\' 8"  (1.727 m)     Head Circumference      Peak Flow      Pain Score 10     Pain Loc      Pain Edu?      Excl. in GC?     Constitutional: Alert and oriented. Well appearing and in no acute distress. Eyes: Conjunctivae are normal. PERRL. EOMI. Head: Atraumatic. Nose: No congestion/rhinnorhea. Mouth/Throat: Mucous membranes are moist.  Oropharynx non-erythematous. Neck: No stridor.   Cardiovascular: Normal rate, regular rhythm. Grossly normal heart sounds.  Good peripheral circulation. Respiratory: Normal respiratory effort.  No retractions. Lungs CTAB. Gastrointestinal: Soft and nontender. No distention. No abdominal bruits. No CVA tenderness. Musculoskeletal: No lower extremity tenderness nor edema.  No joint effusions. Neurologic:  Normal speech and language. No gross focal neurologic deficits are appreciated. No gait instability. Skin:  Skin is warm, dry and intact. No rash noted. Psychiatric: Mood and affect are normal. Speech and behavior are normal.  ____________________________________________   LABS (all labs ordered are listed, but only abnormal results are displayed)  Labs Reviewed - No data to display ____________________________________________  EKG   ____________________________________________  RADIOLOGY  ED MD interpretation:    Official radiology report(s): No results found.  ____________________________________________   PROCEDURES  Procedure(s) performed: None  Procedures  Critical Care performed: No  ____________________________________________   INITIAL IMPRESSION / ASSESSMENT AND PLAN / ED COURSE     Patient presents to the  emergency room with report of "migraine" headache for the past 3 days.  Please see HPI for full report of symptoms. Will start IV and give patient migraine cocktail of Reglan 10 mg, Toradol 30 mg, Benadryl 50 mg and normal saline bolus of 1000 mL and then reevaluate his pain level.  After patient received migraine cocktail and saline bolus of fluid I went back to reevaluate and he reports he is migraine headache has resolved.  He would like to go home at this time.  And I agree with this assessment.  Patient will be discharged home in stable condition at this time I will provide him with a work note for today.      ____________________________________________   FINAL CLINICAL IMPRESSION(S) / ED DIAGNOSES  Final diagnoses:  Migraine without status migrainosus, not intractable, unspecified migraine type     ED Discharge Orders     None        Note:  This document was prepared using Dragon voice recognition software and may include  unintentional dictation errors.     Herschell Dimes, NP 02/10/21 2339    Concha Se, MD 02/11/21 [phone]

## 2021-02-10 NOTE — ED Triage Notes (Signed)
Pt c/o migraine x 3 days

## 2021-02-28 DIAGNOSIS — Z419 Encounter for procedure for purposes other than remedying health state, unspecified: Secondary | ICD-10-CM | POA: Diagnosis not present

## 2021-03-28 DIAGNOSIS — Z419 Encounter for procedure for purposes other than remedying health state, unspecified: Secondary | ICD-10-CM | POA: Diagnosis not present

## 2021-04-28 DIAGNOSIS — Z419 Encounter for procedure for purposes other than remedying health state, unspecified: Secondary | ICD-10-CM | POA: Diagnosis not present

## 2021-05-28 DIAGNOSIS — Z419 Encounter for procedure for purposes other than remedying health state, unspecified: Secondary | ICD-10-CM | POA: Diagnosis not present

## 2021-06-07 ENCOUNTER — Emergency Department: Payer: Medicaid Other

## 2021-06-07 ENCOUNTER — Encounter: Payer: Self-pay | Admitting: Emergency Medicine

## 2021-06-07 ENCOUNTER — Other Ambulatory Visit: Payer: Self-pay

## 2021-06-07 ENCOUNTER — Emergency Department
Admission: EM | Admit: 2021-06-07 | Discharge: 2021-06-07 | Disposition: A | Discharge status: Home / Self Care | Payer: Medicaid Other | Attending: Emergency Medicine | Admitting: Emergency Medicine

## 2021-06-07 DIAGNOSIS — Y990 Civilian activity done for income or pay: Secondary | ICD-10-CM | POA: Diagnosis not present

## 2021-06-07 DIAGNOSIS — S6991XA Unspecified injury of right wrist, hand and finger(s), initial encounter: Secondary | ICD-10-CM | POA: Diagnosis present

## 2021-06-07 DIAGNOSIS — W228XXA Striking against or struck by other objects, initial encounter: Secondary | ICD-10-CM | POA: Diagnosis not present

## 2021-06-07 DIAGNOSIS — S63501A Unspecified sprain of right wrist, initial encounter: Secondary | ICD-10-CM | POA: Insufficient documentation

## 2021-06-07 MED ORDER — IBUPROFEN 800 MG PO TABS
800.0000 mg | ORAL_TABLET | Freq: Once | ORAL | Status: AC
  Administered 2021-06-07: 800 mg via ORAL
  Filled 2021-06-07: qty 1

## 2021-06-07 NOTE — ED Provider Notes (Signed)
? ?[facility] ?Provider Note ? ? ? Event Date/Time  ? First MD Initiated Contact with Patient 06/07/21 0038   ?  (approximate) ? ? ?History  ? ?Wrist Pain ? ? ?HPI ? ?Jesus Mason is a 21 y.o. male right-hand-dominant with history of ADHD who presents to the emergency department with a right wrist injury that occurred at work.  States he thinks he hit it on something when moving boxes.  No other injury. ? ? ?History provided by patient. ? ? ? ?Past Medical History:  ?Diagnosis Date  ? ADHD (attention deficit hyperactivity disorder)   ? Renal disorder   ? kidney stones  ? ? ?Past Surgical History:  ?Procedure Laterality Date  ? ADENOIDECTOMY    ? TONSILLECTOMY    ? TYMPANOSTOMY TUBE PLACEMENT    ? ? ?MEDICATIONS:  ?Prior to Admission medications   ?Medication Sig Start Date End Date Taking? Authorizing Provider  ?acetaminophen-codeine (TYLENOL #3) 300-30 MG tablet Take 1 tablet by mouth every 6 (six) hours as needed for moderate pain. 01/13/15   Tommi RumpsSummers, Rhonda L, PA-C  ?busPIRone (BUSPAR) 10 MG tablet Take 1 tablet by mouth daily.  09/03/14   [provider]  ?dexmethylphenidate (FOCALIN XR) 15 MG 24 hr capsule Take 15 mg by mouth daily.    [provider]  ?dexmethylphenidate (FOCALIN) 10 MG tablet Take 10 mg by mouth daily.    [provider]  ?dexmethylphenidate (FOCALIN) 5 MG tablet Take 5 mg by mouth daily.    [provider]  ?diazepam (VALIUM) 2 MG tablet Take 1 tablet (2 mg total) by mouth every 8 (eight) hours as needed for muscle spasms. 01/13/15   Tommi RumpsSummers, Rhonda L, PA-C  ?HYDROcodone-acetaminophen (NORCO/VICODIN) 5-325 MG tablet Take 1 tablet by mouth every 6 (six) hours as needed for moderate pain. 09/29/19   Fisher, Roselyn BeringSusan W, PA-C  ?ketorolac (TORADOL) 10 MG tablet Take 1 tablet (10 mg total) by mouth every 6 (six) hours as needed. 09/29/19   Fisher, Roselyn BeringSusan W, PA-C  ?meloxicam (MOBIC) 15 MG tablet Take 1 tablet (15 mg total) by mouth daily.  09/20/20   Cuthriell, Delorise RoyalsJonathan D, PA-C  ?methocarbamol (ROBAXIN) 500 MG tablet Take 1 tablet (500 mg total) by mouth 4 (four) times daily. 09/20/20   Cuthriell, Delorise RoyalsJonathan D, PA-C  ?ondansetron (ZOFRAN ODT) 4 MG disintegrating tablet Take 1 tablet (4 mg total) by mouth every 6 (six) hours as needed for nausea or vomiting. 09/30/14   Sharyn CreamerQuale, Mark, MD  ?traZODone (DESYREL) 100 MG tablet Take 100 mg by mouth at bedtime as needed for sleep.    [provider]  ? ? ?Physical Exam  ? ?Triage Vital Signs: ?ED Triage Vitals  ?Enc Vitals Group  ?   BP 06/07/21 0026 (!) 136/98  ?   Pulse Rate 06/07/21 0026 (!) 103  ?   Resp 06/07/21 0026 18  ?   Temp 06/07/21 0026 98.5 ?F (36.9 ?C)  ?   Temp Source 06/07/21 0026 Oral  ?   SpO2 06/07/21 0026 100 %  ?   Weight 06/07/21 0025 220 lb (99.8 kg)  ?   Height 06/07/21 0025 5\' 8"  (1.727 m)  ?   Head Circumference --   ?   Peak Flow --   ?   Pain Score 06/07/21 0026 4  ?   Pain Loc --   ?   Pain Edu? --   ?   Excl. in GC? --   ? ? ?  Most recent vital signs: ?Vitals:  ? 06/07/21 0026  ?BP: (!) 136/98  ?Pulse: (!) 103  ?Resp: 18  ?Temp: 98.5 ?F (36.9 ?C)  ?SpO2: 100%  ? ? ? ?CONSTITUTIONAL: Alert and responds appropriately to questions. Well-appearing; well-nourished ?HEAD: Normocephalic, atraumatic ?EYES: Conjunctivae clear, pupils appear equal ?ENT: normal nose; moist mucous membranes ?NECK: Normal range of motion ?CARD: Regular rate and rhythm ?RESP: Normal chest excursion without splinting or tachypnea; no hypoxia or respiratory distress, speaking full sentences ?ABD/GI: non-distended ?EXT: Normal ROM in all joints, no major deformities noted, no ecchymosis or soft tissue swelling noted to the right wrist.  Normal capillary refill.  Compartments soft.  2+ right radial pulse.  Normal sensation. ?SKIN: Normal color for age and race, no rashes on exposed skin ?NEURO: Moves all extremities equally, normal speech, no facial asymmetry noted ?PSYCH: The patient's mood and manner are  appropriate. Grooming and personal hygiene are appropriate. ? ?ED Results / Procedures / Treatments  ? ?LABS: ?(all labs ordered are listed, but only abnormal results are displayed) ?Labs Reviewed - No data to display ? ? ?EKG: ? ? ?RADIOLOGY: ?My personal review and interpretation of imaging: X-ray of the right wrist shows no acute abnormality. ? ?I have personally reviewed all radiology reports. ?DG Wrist Complete Right ? ?Result Date: 06/07/2021 ?CLINICAL DATA:  Right wrist injury, pain EXAM: RIGHT WRIST - COMPLETE 3+ VIEW COMPARISON:  None Available. FINDINGS: There is no evidence of fracture or dislocation. There is no evidence of arthropathy or other focal bone abnormality. Soft tissues are unremarkable. IMPRESSION: Negative. Electronically Signed   By: Helyn Numbers M.D.   On: 06/07/2021 00:40   ? ? ?PROCEDURES: ? ?Critical Care performed: No ? ? ?Procedures ? ? ? ?IMPRESSION / MDM / ASSESSMENT AND PLAN / ED COURSE  ?I reviewed the triage vital signs and the nursing notes. ? ? ?Patient here with right wrist injury that occurred at work. ? ? ? ? ?DIFFERENTIAL DIAGNOSIS (includes but not limited to):   Right wrist contusion, sprain, fracture, dislocation ? ? ?PLAN: We will obtain x-ray of the right wrist and give ibuprofen. ? ? ?MEDICATIONS GIVEN IN ED: ?Medications  ?ibuprofen (ADVIL) tablet 800 mg (800 mg Oral Given 06/07/21 0052)  ? ? ? ?ED COURSE: X-ray reviewed and interpreted by myself and radiologist and shows no fracture, dislocation.  Suspect sprain/contusion.  Recommended RICE, ibuprofen and Tylenol.  Will discharge home.  Given PCP follow-up. ? ? ?At this time, I do not feel there is any life-threatening condition present. I reviewed all nursing notes, vitals, pertinent previous records.  All lab and urine results, EKGs, imaging ordered have been independently reviewed and interpreted by myself.  I reviewed all available radiology reports from any imaging ordered this visit.  Based on my assessment, I  feel the patient is safe to be discharged home without further emergent workup and can continue workup as an outpatient as needed. Discussed all findings, treatment plan as well as usual and customary return precautions with patient.  They verbalize understanding and are comfortable with this plan.  Outpatient follow-up has been provided as needed.  All questions have been answered. ? ? ? ?CONSULTS: No emergent orthopedic consult needed at this time given x-ray is reassuring and patient is neurovascular intact distally. ? ? ?OUTSIDE RECORDS REVIEWED: No previous records for review. ? ? ? ? ? ? ? ? ?FINAL CLINICAL IMPRESSION(S) / ED DIAGNOSES  ? ?Final diagnoses:  ?Sprain of right wrist, initial encounter  ? ? ? ?  Rx / DC Orders  ? ?ED Discharge Orders   ? ? None  ? ?  ? ? ? ?Note:  This document was prepared using Dragon voice recognition software and may include unintentional dictation errors. ?  ?Omari Koslosky, Layla Maw, DO ?06/07/21 0054 ? ?

## 2021-06-07 NOTE — Discharge Instructions (Signed)
You may alternate Tylenol 1000 mg every 6 hours as needed for pain, fever and Ibuprofen 800 mg every 6-8 hours as needed for pain, fever.  Please take Ibuprofen with food.  Do not take more than 4000 mg of Tylenol (acetaminophen) in a 24 hour period.  Steps to find a Primary Care Provider (PCP):  Call [phone] or [phone] to access "[facility] Find a Doctor Service."  2.  You may also go on the [facility] website at [email or web address]  

## 2021-06-07 NOTE — ED Triage Notes (Signed)
Pt reports 2 days ago while at work [employer]) hit his rt wrist on a shelf in the cooler; c/o persistent pain since; workers comp profile indicates no drug testing required at this  ?

## 2021-06-28 DIAGNOSIS — Z419 Encounter for procedure for purposes other than remedying health state, unspecified: Secondary | ICD-10-CM | POA: Diagnosis not present

## 2021-07-28 DIAGNOSIS — Z419 Encounter for procedure for purposes other than remedying health state, unspecified: Secondary | ICD-10-CM | POA: Diagnosis not present

## 2021-08-14 DIAGNOSIS — H5213 Myopia, bilateral: Secondary | ICD-10-CM | POA: Diagnosis not present

## 2021-08-28 DIAGNOSIS — Z419 Encounter for procedure for purposes other than remedying health state, unspecified: Secondary | ICD-10-CM | POA: Diagnosis not present

## 2021-09-28 DIAGNOSIS — Z419 Encounter for procedure for purposes other than remedying health state, unspecified: Secondary | ICD-10-CM | POA: Diagnosis not present

## 2021-10-15 ENCOUNTER — Other Ambulatory Visit: Payer: Self-pay

## 2021-10-15 ENCOUNTER — Emergency Department: Payer: Medicaid Other

## 2021-10-15 ENCOUNTER — Encounter: Payer: Self-pay | Admitting: *Deleted

## 2021-10-15 ENCOUNTER — Emergency Department
Admission: EM | Admit: 2021-10-15 | Discharge: 2021-10-15 | Disposition: A | Discharge status: Home / Self Care | Payer: Medicaid Other | Attending: Emergency Medicine | Admitting: Emergency Medicine

## 2021-10-15 DIAGNOSIS — Z203 Contact with and (suspected) exposure to rabies: Secondary | ICD-10-CM | POA: Insufficient documentation

## 2021-10-15 DIAGNOSIS — S61452A Open bite of left hand, initial encounter: Secondary | ICD-10-CM | POA: Diagnosis not present

## 2021-10-15 DIAGNOSIS — Z2914 Encounter for prophylactic rabies immune globin: Secondary | ICD-10-CM | POA: Insufficient documentation

## 2021-10-15 DIAGNOSIS — W540XXA Bitten by dog, initial encounter: Secondary | ICD-10-CM | POA: Diagnosis not present

## 2021-10-15 DIAGNOSIS — S61401A Unspecified open wound of right hand, initial encounter: Secondary | ICD-10-CM | POA: Diagnosis not present

## 2021-10-15 DIAGNOSIS — Z23 Encounter for immunization: Secondary | ICD-10-CM | POA: Diagnosis not present

## 2021-10-15 DIAGNOSIS — S6991XA Unspecified injury of right wrist, hand and finger(s), initial encounter: Secondary | ICD-10-CM | POA: Diagnosis present

## 2021-10-15 MED ORDER — RABIES VACCINE, PCEC IM SUSR
1.0000 mL | Freq: Once | INTRAMUSCULAR | Status: AC
  Administered 2021-10-15: 1 mL via INTRAMUSCULAR
  Filled 2021-10-15: qty 1

## 2021-10-15 MED ORDER — TETANUS-DIPHTH-ACELL PERTUSSIS 5-2.5-18.5 LF-MCG/0.5 IM SUSY
0.5000 mL | PREFILLED_SYRINGE | Freq: Once | INTRAMUSCULAR | Status: AC
  Administered 2021-10-15: 0.5 mL via INTRAMUSCULAR
  Filled 2021-10-15: qty 0.5

## 2021-10-15 MED ORDER — AMOXICILLIN-POT CLAVULANATE 875-125 MG PO TABS: 1.0000 | ORAL_TABLET | Freq: Two times a day (BID) | ORAL | 0 refills | Status: AC

## 2021-10-15 MED ORDER — RABIES IMMUNE GLOBULIN 300 UNIT/2ML IJ SOLN
1875.0000 [IU] | Freq: Once | INTRAMUSCULAR | Status: AC
  Administered 2021-10-15: 1875 [IU] via INTRAMUSCULAR
  Filled 2021-10-15: qty 10

## 2021-10-15 NOTE — ED Triage Notes (Signed)
Pt has dog bite to left hand  puncture wound noted.  Bleeding controlled.  Pt alert  speech clear.

## 2021-10-15 NOTE — Discharge Instructions (Signed)
Your x-ray did not reveal any broken bones.  Please take the antibiotics as prescribed.  You were given the first dose of the rabies vaccination. You must get a repeat immunoglobulin on days 3, 7, and 14. Today is considered day 0. You may go to the [place] [facility] or [place] [facility] for this:  [place] urgent Care at [facility], open Monday through Friday 8am-4pm with online scheduling. [place] [facility] at [facility] [address], [place]. Open Monday-Friday 8am-8pm, Saturday and Sunday 8am-4pm with online scheduling or walk in.  Return for any new, worsening, or change in symptoms or other concerns. It was a pleasure caring for you.

## 2021-10-15 NOTE — ED Provider Notes (Signed)
[facility] Provider Note    Event Date/Time   First MD Initiated Contact with Patient 10/15/21 1720     (approximate)   History   Animal Bite   HPI  Jesus Mason is a 21 y.o. male right-hand-dominant presents today for evaluation of dog bite to his left hand.  Patient reports that this is a dog that he has seen around the neighborhood who he believes to be abused.  He was trying to put a leash on the dog to bring him to his house to give him food when the dog bit him on the left hand.  He is unsure what type of dog this is but reports that it is a big dog.  He called animal control.  He he reports that his tetanus vaccination is not up-to-date.  He has no idea what the dog's vaccination status is and wishes to have the rabies vaccine.  He denies paresthesias.  No weakness in his hand.     Physical Exam   Triage Vital Signs: ED Triage Vitals  Enc Vitals Group     BP 10/15/21 1713 130/74     Pulse Rate 10/15/21 1713 (!) 123     Resp 10/15/21 1713 18     Temp 10/15/21 1713 99.3 F (37.4 C)     Temp Source 10/15/21 1713 Oral     SpO2 10/15/21 1713 95 %     Weight 10/15/21 1711 205 lb (93 kg)     Height 10/15/21 1711 5\' 8"  (1.727 m)     Head Circumference --      Peak Flow --      Pain Score 10/15/21 1710 10     Pain Loc --      Pain Edu? --      Excl. in [place]? --     Most recent vital signs: Vitals:   10/15/21 1713 10/15/21 1853  BP: 130/74 122/80  Pulse: (!) 123 75  Resp: 18 17  Temp: 99.3 F (37.4 C)   SpO2: 95% 98%    Physical Exam Vitals and nursing note reviewed.  Constitutional:      General: Awake and alert. No acute distress.    Appearance: Normal appearance. The patient is obese.  HENT:     Head: Normocephalic and atraumatic.     Mouth: Mucous membranes are moist.  Eyes:     General: PERRL. Normal EOMs        Right eye: No discharge.        Left eye: No discharge.     Conjunctiva/sclera: Conjunctivae normal.   Cardiovascular:     Rate and Rhythm: Normal rate and regular rhythm.     Pulses: Normal pulses.  Pulmonary:     Effort: Pulmonary effort is normal. No respiratory distress.  Abdominal:     Abdomen is soft. There is no abdominal tenderness. Musculoskeletal:        General: No swelling. Normal range of motion.     Cervical back: Normal range of motion and neck supple.  Right hand: Palm of hand with 2 to 3 mm wound.  No protruding subcutaneous fatty tissue.  No active bleeding.  Mild tenderness to palpation.  He has full and normal range of motion of all fingers at isolated MCP, PIP, and DIP against resistance.  No retained foreign body noted.  Normal abduction and adduction of thumb against resistance.  No tenderness at the wrist.  Sensation intact light touch throughout hand and  arm.  Normal radial pulse.  Compartment soft and compressible throughout. Skin:    General: Skin is warm and dry.     Capillary Refill: Capillary refill takes less than 2 seconds.     Findings: No rash.  Neurological:     Mental Status: The patient is awake and alert.      ED Results / Procedures / Treatments   Labs (all labs ordered are listed, but only abnormal results are displayed) Labs Reviewed - No data to display   EKG     RADIOLOGY I independently reviewed and interpreted imaging and agree with radiologists findings.     PROCEDURES:  Critical Care performed:   Procedures   MEDICATIONS ORDERED IN ED: Medications  rabies immune globulin (HYPERRAB/KEDRAB) 1,875 Units (0 Units Intramuscular Stopped 10/15/21 1846)  rabies vaccine (RABAVERT) injection 1 mL (1 mL Intramuscular Given 10/15/21 1834)  Tdap (BOOSTRIX) injection 0.5 mL (0.5 mLs Intramuscular Given 10/15/21 1833)     IMPRESSION / MDM / ASSESSMENT AND PLAN / ED COURSE  I reviewed the triage vital signs and the nursing notes.   Differential diagnosis includes, but is not limited to, dog bite, ligamental injury, retained foreign  body, fracture.  Patient is awake and alert, anxious in appearance though nontoxic appearing.  He is full normal range of motion of his fingers and hand, normal strength and sensation against resistance, do not suspect ligamental injury.  X-ray of her hand was obtained and demonstrates no acute osseous injury or retained foreign body.  He has a small 2 to 3 mm puncture wound to the palm of his hand.  This was irrigated extensively.  Too small for closure as this would increase his risk for infection which patient agrees with.  His tetanus was updated.  We discussed the risks and benefits of rabies vaccination including alternatives such as finding the dog and keeping in quarantine with animal control's guidance, however he wishes to proceed with rabies vaccination.  This was given in the emergency department, and he was instructed for follow-up rabies vaccination requirements.  We discussed wound care in the meantime.  He was started on Augmentin.  We discussed tricked return precautions and the importance of close outpatient follow-up.  Patient understands and agrees with plan.  He was discharged in stable condition. He requested a work note which was provided.   Patient's presentation is most consistent with acute complicated illness / injury requiring diagnostic workup.   FINAL CLINICAL IMPRESSION(S) / ED DIAGNOSES   Final diagnoses:  Dog bite, initial encounter     Rx / DC Orders   ED Discharge Orders          Ordered    amoxicillin-clavulanate (AUGMENTIN) 875-125 MG tablet  2 times daily        10/15/21 1822             Note:  This document was prepared using Dragon voice recognition software and may include unintentional dictation errors.   Emeline Gins 10/15/21 1856    Carrie Mew, MD 10/16/21 [phone]

## 2021-10-28 DIAGNOSIS — Z419 Encounter for procedure for purposes other than remedying health state, unspecified: Secondary | ICD-10-CM | POA: Diagnosis not present

## 2021-11-09 IMAGING — CT CT RENAL STONE PROTOCOL
3 of 4 series · 9 of 46 positions shown, 16 images · non-contrast
Comparison: 09/30/2014

CLINICAL DATA: Left flank pain. Dysuria. History kidney stones.
Symptoms for 1 week.

EXAM:
CT ABDOMEN AND PELVIS WITHOUT CONTRAST
TECHNIQUE: Multidetector CT imaging of the abdomen and pelvis was performed
following the standard protocol without IV contrast.

[Series 4: lung bases · axial · 0.77mm/px · z∈[-710,-630]mm · 5 of 26 slices shown, 10 images]
[im 5/26  soft-tissue]
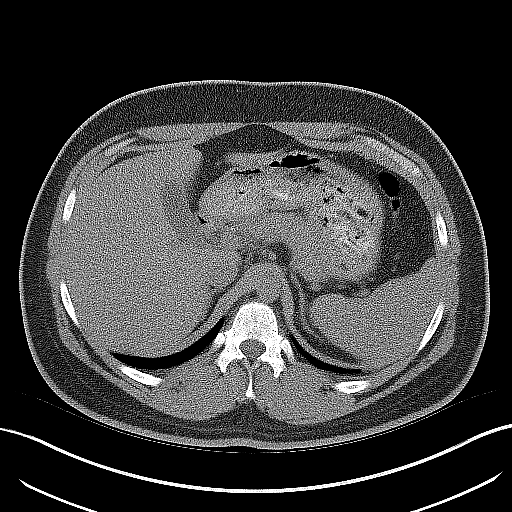
[im 5/26  bone]
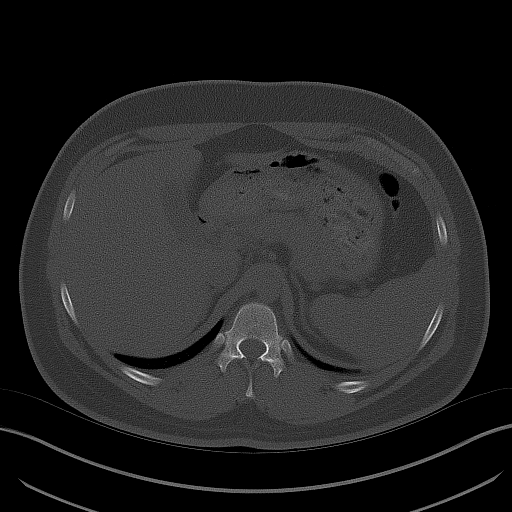
[im 9/26  soft-tissue]
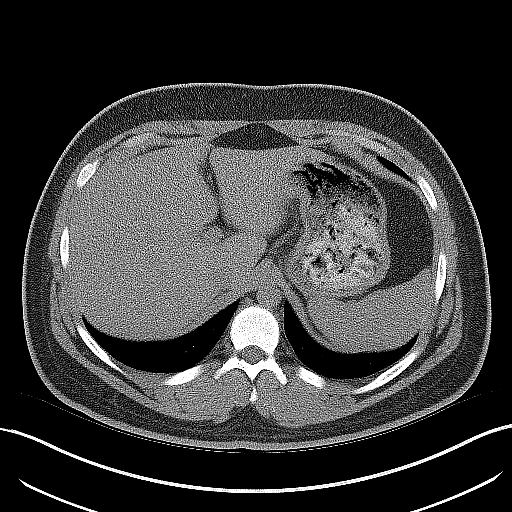
[im 9/26  lung]
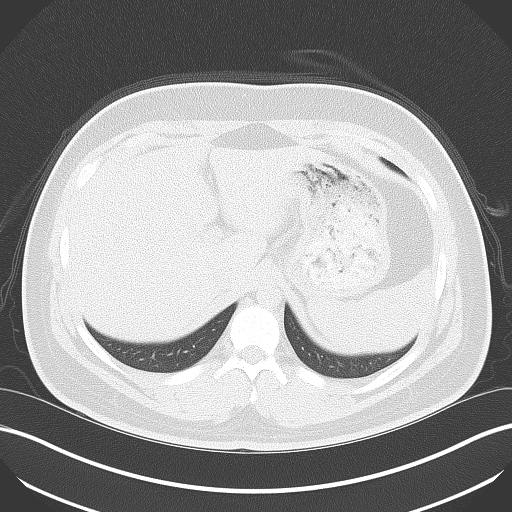
[im 13/26  soft-tissue]
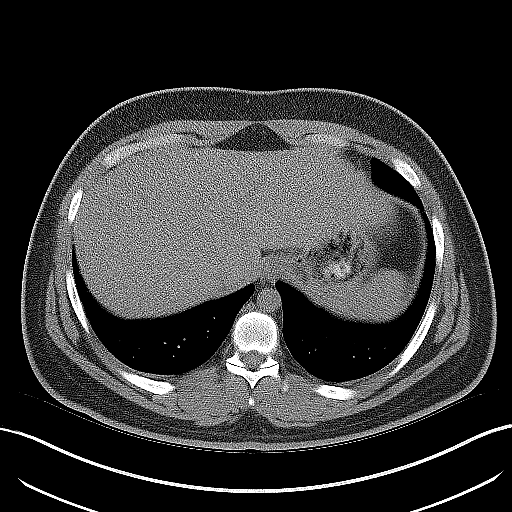
[im 13/26  lung]
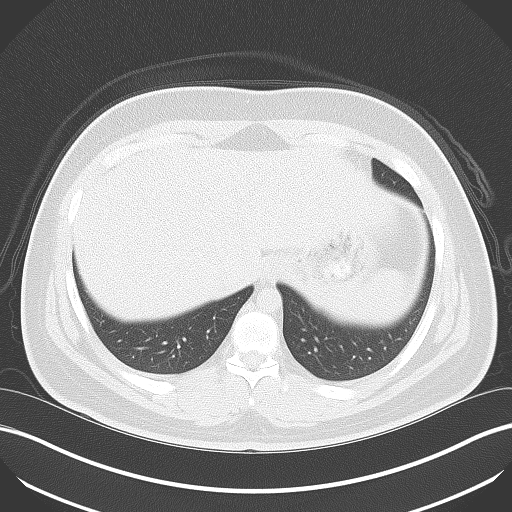
[im 17/26  soft-tissue]
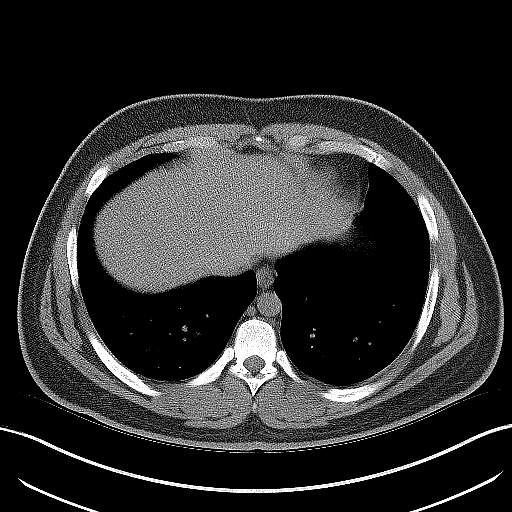
[im 17/26  lung]
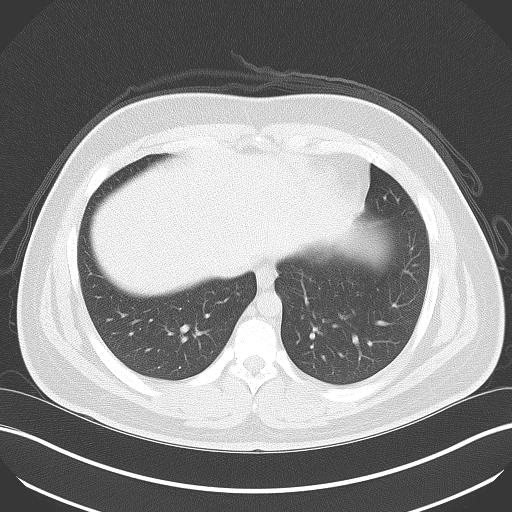
[im 21/26  soft-tissue]
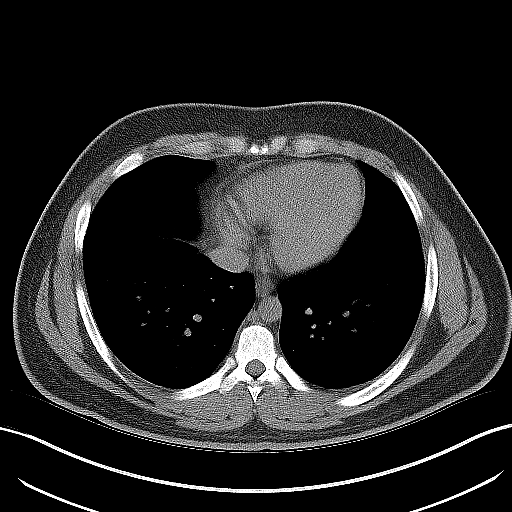
[im 21/26  lung]
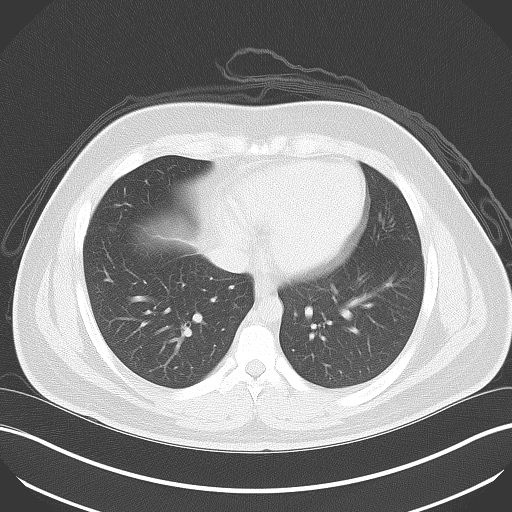

[Series 5: coronal · coronal · 0.74mm/px · 3 of 139 slices shown, 4 images]
[im 47/139  soft-tissue]
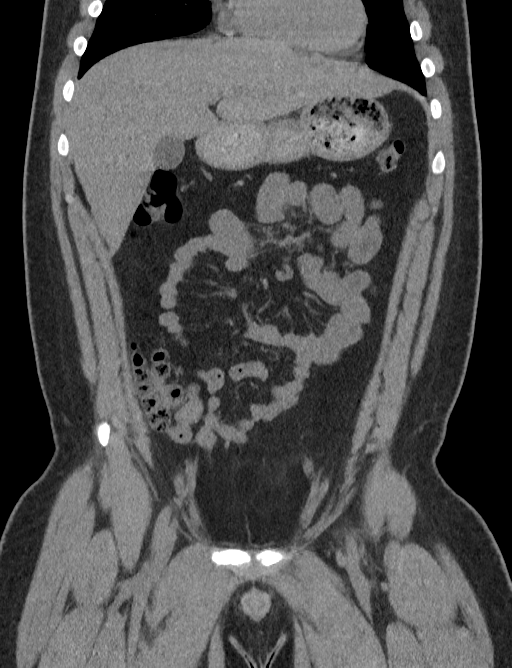
[im 62/139  soft-tissue]
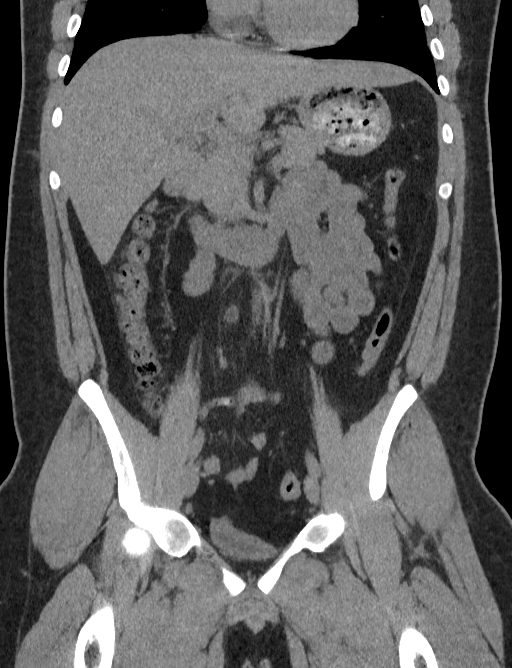
[im 62/139  bone]
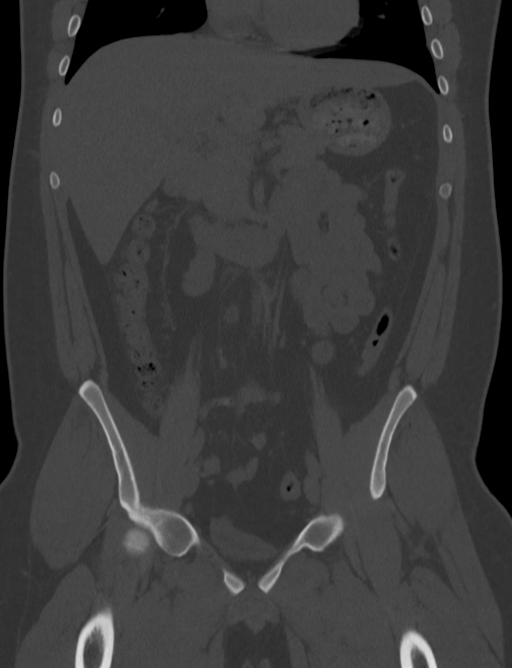
[im 77/139  soft-tissue]
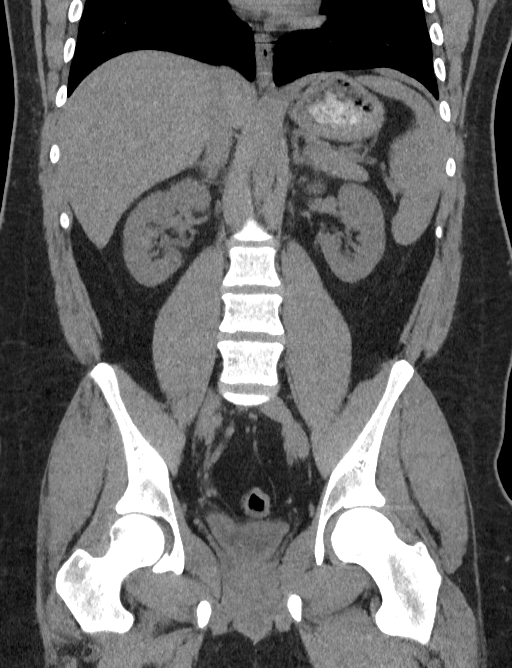

[Series 6: sagittal · sagittal · 0.54mm/px · 1 of 191 slices shown, 2 images]
[im 64/191  soft-tissue]
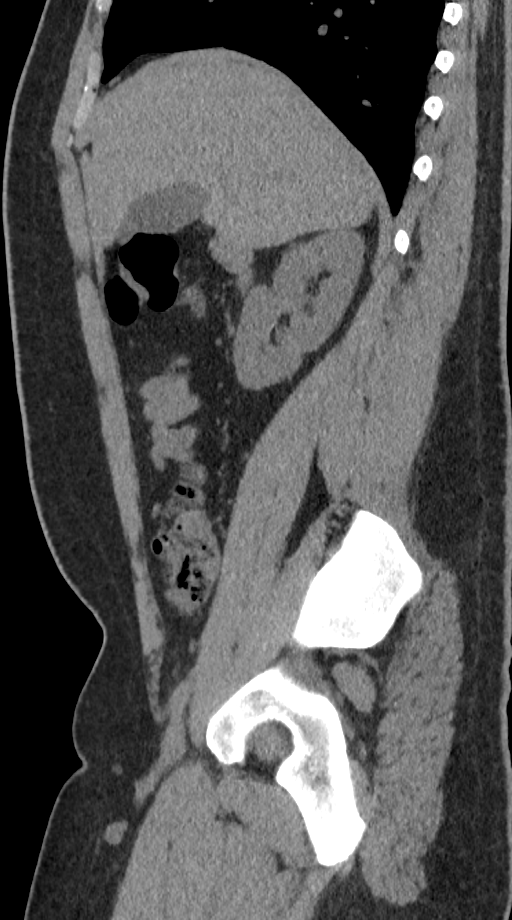
[im 64/191  bone]
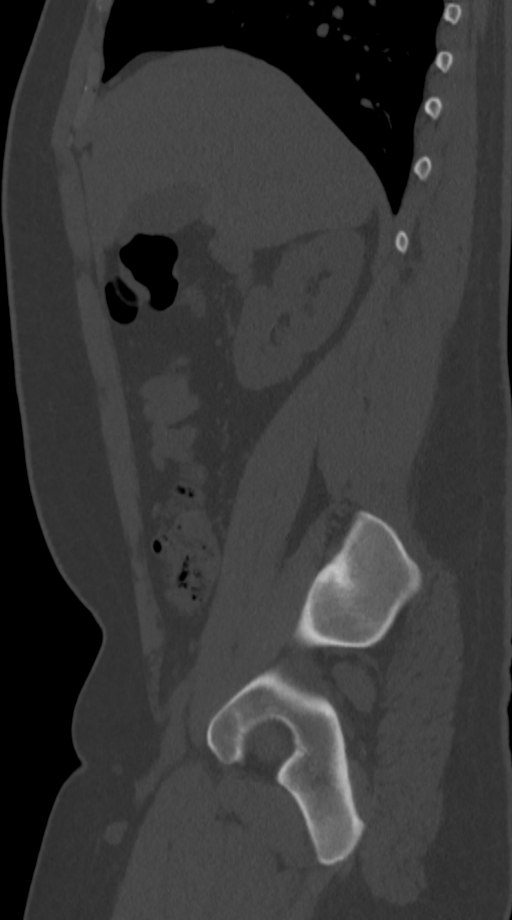

[9 of 46 positions shown; findings below may reference images not displayed]

FINDINGS: Lower chest: Clear lung bases. Normal heart size without pericardial
or pleural effusion.

Hepatobiliary: Normal liver. Normal gallbladder, without biliary
ductal dilatation.

Pancreas: Normal, without mass or ductal dilatation.

Spleen: Normal in size, without focal abnormality.

Adrenals/Urinary Tract: Normal adrenal glands. Multiple punctate
right renal collecting system calculi. Suspect a lower pole punctate
left renal collecting system stone. Although there is no significant
left-sided hydroureteronephrosis, a left ureterovesicular junction 2
mm stone is seen on 79/2 and 84 coronal.

A right ureterovesicular junction 3 mm stone on 79/2 and 84 coronal.

Stomach/Bowel: Normal stomach, without wall thickening. Normal
colon, appendix, and terminal ileum. Normal small bowel.

Vascular/Lymphatic: Normal aortic caliber. No abdominopelvic
adenopathy.

Reproductive: Normal prostate.

Other: No significant free fluid.

Musculoskeletal: No acute osseous abnormality.
IMPRESSION: 1. Bilateral ureterovesicular junction punctate calculi. No
significant proximal obstruction.
2. Right nephrolithiasis.  Possible punctate left nephrolithiasis.

## 2021-11-28 DIAGNOSIS — Z419 Encounter for procedure for purposes other than remedying health state, unspecified: Secondary | ICD-10-CM | POA: Diagnosis not present

## 2021-12-28 DIAGNOSIS — Z419 Encounter for procedure for purposes other than remedying health state, unspecified: Secondary | ICD-10-CM | POA: Diagnosis not present

## 2022-01-17 ENCOUNTER — Emergency Department: Payer: Medicaid Other

## 2022-01-17 ENCOUNTER — Encounter: Payer: Self-pay | Admitting: Emergency Medicine

## 2022-01-17 ENCOUNTER — Emergency Department
Admission: EM | Admit: 2022-01-17 | Discharge: 2022-01-17 | Disposition: A | Discharge status: Home / Self Care | Payer: Medicaid Other | Attending: Emergency Medicine | Admitting: Emergency Medicine

## 2022-01-17 ENCOUNTER — Other Ambulatory Visit: Payer: Self-pay

## 2022-01-17 DIAGNOSIS — Y990 Civilian activity done for income or pay: Secondary | ICD-10-CM | POA: Insufficient documentation

## 2022-01-17 DIAGNOSIS — S93492A Sprain of other ligament of left ankle, initial encounter: Secondary | ICD-10-CM | POA: Insufficient documentation

## 2022-01-17 DIAGNOSIS — R001 Bradycardia, unspecified: Secondary | ICD-10-CM | POA: Insufficient documentation

## 2022-01-17 DIAGNOSIS — W172XXA Fall into hole, initial encounter: Secondary | ICD-10-CM | POA: Diagnosis not present

## 2022-01-17 DIAGNOSIS — S99912A Unspecified injury of left ankle, initial encounter: Secondary | ICD-10-CM | POA: Diagnosis not present

## 2022-01-17 DIAGNOSIS — S93432A Sprain of tibiofibular ligament of left ankle, initial encounter: Secondary | ICD-10-CM | POA: Diagnosis not present

## 2022-01-17 DIAGNOSIS — M25572 Pain in left ankle and joints of left foot: Secondary | ICD-10-CM | POA: Diagnosis present

## 2022-01-17 MED ORDER — MELOXICAM 15 MG PO TABS: 15.0000 mg | ORAL_TABLET | Freq: Every day | ORAL | 0 refills | Status: AC

## 2022-01-17 NOTE — ED Triage Notes (Signed)
Patient to ED for left ankle pain after falling into a hole at work. States pain has been ongoing for 4-5 days. Ambulatory to triage.

## 2022-01-17 NOTE — ED Provider Notes (Signed)
[facility] Provider Note  Patient Contact: 8:57 PM (approximate)   History   Ankle Pain   HPI  Jesus Mason is a 21 y.o. male presents to the emerged  department with acute left ankle pain after an inversion type ankle injury.  Patient has been able to bear weight since injury occurred.  No chest pain, chest tightness or abdominal pain.  No abrasions or lacerations.      Physical Exam   Triage Vital Signs: ED Triage Vitals [01/17/22 1834]  Enc Vitals Group     BP (!) 143/80     Pulse Rate 73     Resp 18     Temp 98.5 F (36.9 C)     Temp Source Oral     SpO2 98 %     Weight      Height      Head Circumference      Peak Flow      Pain Score 5     Pain Loc      Pain Edu?      Excl. in [place]?     Most recent vital signs: Vitals:   01/17/22 1834 01/17/22 1954  BP: (!) 143/80 (!) 111/55  Pulse: 73 (!) 59  Resp: 18 20  Temp: 98.5 F (36.9 C) 98.3 F (36.8 C)  SpO2: 98% 99%     General: Alert and in no acute distress. Eyes:  PERRL. EOMI. Head: No acute traumatic findings ENT:      Nose: No congestion/rhinnorhea.      Mouth/Throat: Mucous membranes are moist. Neck: No stridor. No cervical spine tenderness to palpation.  Cardiovascular:  Good peripheral perfusion Respiratory: Normal respiratory effort without tachypnea or retractions. Lungs CTAB. Good air entry to the bases with no decreased or absent breath sounds. Gastrointestinal: Bowel sounds 4 quadrants. Soft and nontender to palpation. No guarding or rigidity. No palpable masses. No distention. No CVA tenderness. Musculoskeletal: Full range of motion to all extremities.  Patient has tenderness to palpation over the anterior talofibular ligament on the left.  Palpable dorsalis pedis pulse, left. Neurologic:  No gross focal neurologic deficits are appreciated.  Skin:   No rash noted Other:   ED Results / Procedures / Treatments   Labs (all labs ordered are listed,  but only abnormal results are displayed) Labs Reviewed - No data to display     RADIOLOGY  I personally viewed and evaluated these images as part of my medical decision making, as well as reviewing the written report by the radiologist.  ED Provider Interpretation: No acute bony abnormality.   PROCEDURES:  Critical Care performed: No  Procedures   MEDICATIONS ORDERED IN ED: Medications - No data to display   IMPRESSION / MDM / ASSESSMENT AND PLAN / ED COURSE  I reviewed the triage vital signs and the nursing notes.                              Assessment and plan Ankle sprain 21 year old male presents to the emergency department with acute left ankle pain.  Patient was mildly bradycardic at triage vital signs otherwise reassuring.  On exam, patient was alert and nontoxic.Marland Kitchen  X-ray showed no acute bony abnormality.  Recommended rest, ice, compression elevation.  Patient declined crutches and requested work note.  Work note was provided.   FINAL CLINICAL IMPRESSION(S) / ED DIAGNOSES   Final diagnoses:  Sprain of anterior  talofibular ligament of left ankle, initial encounter     Rx / DC Orders   ED Discharge Orders          Ordered    meloxicam (MOBIC) 15 MG tablet  Daily        01/17/22 2055             Note:  This document was prepared using Dragon voice recognition software and may include unintentional dictation errors.   Pia Mau [place], PA-C 01/17/22 2059    Dionne Bucy, MD 01/17/22 [phone]

## 2022-01-17 NOTE — Discharge Instructions (Addendum)
Take Meloxicam once daily for pain and inflammation.  

## 2022-01-28 DIAGNOSIS — Z419 Encounter for procedure for purposes other than remedying health state, unspecified: Secondary | ICD-10-CM | POA: Diagnosis not present

## 2022-02-19 ENCOUNTER — Other Ambulatory Visit: Payer: Self-pay

## 2022-02-19 ENCOUNTER — Emergency Department: Payer: Medicaid Other

## 2022-02-19 ENCOUNTER — Encounter: Payer: Self-pay | Admitting: Emergency Medicine

## 2022-02-19 ENCOUNTER — Emergency Department
Admission: EM | Admit: 2022-02-19 | Discharge: 2022-02-19 | Disposition: A | Discharge status: Home / Self Care | Payer: Medicaid Other | Attending: Emergency Medicine | Admitting: Emergency Medicine

## 2022-02-19 DIAGNOSIS — N200 Calculus of kidney: Secondary | ICD-10-CM | POA: Diagnosis not present

## 2022-02-19 DIAGNOSIS — N132 Hydronephrosis with renal and ureteral calculous obstruction: Secondary | ICD-10-CM | POA: Diagnosis not present

## 2022-02-19 DIAGNOSIS — R1030 Lower abdominal pain, unspecified: Secondary | ICD-10-CM | POA: Diagnosis present

## 2022-02-19 LAB — COMPREHENSIVE METABOLIC PANEL
ALT: 17 U/L (ref 0–44)
AST: 16 U/L (ref 15–41)
Albumin: 4.8 g/dL (ref 3.5–5.0)
Alkaline Phosphatase: 56 U/L (ref 38–126)
Anion gap: 10 (ref 5–15)
BUN: 9 mg/dL (ref 6–20)
CO2: 25 mmol/L (ref 22–32)
Calcium: 9.4 mg/dL (ref 8.9–10.3)
Chloride: 104 mmol/L (ref 98–111)
Creatinine, Ser: 1.08 mg/dL (ref 0.61–1.24)
GFR, Estimated: >60 mL/min (ref >60)
Glucose, Bld: 107 mg/dL — ABNORMAL HIGH (ref 70–99)
Potassium: 3.7 mmol/L (ref 3.5–5.1)
Sodium: 139 mmol/L (ref 135–145)
Total Bilirubin: 1.2 mg/dL (ref 0.3–1.2)
Total Protein: 7.6 g/dL (ref 6.5–8.1)

## 2022-02-19 LAB — URINALYSIS, ROUTINE W REFLEX MICROSCOPIC
Bacteria, UA: NONE SEEN
Bilirubin Urine: NEGATIVE
Glucose, UA: NEGATIVE mg/dL
Ketones, ur: 20 mg/dL — AB
Nitrite: NEGATIVE
Protein, ur: NEGATIVE mg/dL
Specific Gravity, Urine: 1.011 (ref 1.005–1.030)
pH: 6 (ref 5.0–8.0)

## 2022-02-19 LAB — CBC WITH DIFFERENTIAL/PLATELET
Abs Immature Granulocytes: 0.02 10*3/uL (ref 0.00–0.07)
Basophils Absolute: 0 10*3/uL (ref 0.0–0.1)
Basophils Relative: 0 %
Eosinophils Absolute: 0.1 10*3/uL (ref 0.0–0.5)
Eosinophils Relative: 1 %
HCT: 49.5 % (ref 39.0–52.0)
Hemoglobin: 16.8 g/dL (ref 13.0–17.0)
Immature Granulocytes: 0 %
Lymphocytes Relative: 33 %
Lymphs Abs: 2.6 10*3/uL (ref 0.7–4.0)
MCH: 29.8 pg (ref 26.0–34.0)
MCHC: 33.9 g/dL (ref 30.0–36.0)
MCV: 87.9 fL (ref 80.0–100.0)
Monocytes Absolute: 0.6 10*3/uL (ref 0.1–1.0)
Monocytes Relative: 7 %
Neutro Abs: 4.4 10*3/uL (ref 1.7–7.7)
Neutrophils Relative %: 59 %
Platelets: 217 10*3/uL (ref 150–400)
RBC: 5.63 MIL/uL (ref 4.22–5.81)
RDW: 12.3 % (ref 11.5–15.5)
WBC: 7.7 10*3/uL (ref 4.0–10.5)
nRBC: 0 % (ref 0.0–0.2)

## 2022-02-19 MED ORDER — ONDANSETRON 4 MG PO TBDP: 4.0000 mg | ORAL_TABLET | Freq: Three times a day (TID) | ORAL | 0 refills | Status: DC | PRN

## 2022-02-19 MED ORDER — MORPHINE SULFATE (PF) 4 MG/ML IV SOLN
4.0000 mg | Freq: Once | INTRAVENOUS | Status: AC
  Administered 2022-02-19: 4 mg via INTRAVENOUS
  Filled 2022-02-19: qty 1

## 2022-02-19 MED ORDER — SODIUM CHLORIDE 0.9 % IV BOLUS (SEPSIS)
1000.0000 mL | Freq: Once | INTRAVENOUS | Status: AC
  Administered 2022-02-19: 1000 mL via INTRAVENOUS

## 2022-02-19 MED ORDER — OXYCODONE-ACETAMINOPHEN 5-325 MG PO TABS: 1.0000 | ORAL_TABLET | ORAL | 0 refills | Status: DC | PRN

## 2022-02-19 MED ORDER — TAMSULOSIN HCL 0.4 MG PO CAPS: 0.4000 mg | ORAL_CAPSULE | Freq: Every day | ORAL | 0 refills | Status: AC

## 2022-02-19 MED ORDER — ONDANSETRON HCL 4 MG/2ML IJ SOLN
4.0000 mg | Freq: Once | INTRAMUSCULAR | Status: AC
  Administered 2022-02-19: 4 mg via INTRAVENOUS
  Filled 2022-02-19: qty 2

## 2022-02-19 MED ORDER — KETOROLAC TROMETHAMINE 30 MG/ML IJ SOLN
30.0000 mg | Freq: Once | INTRAMUSCULAR | Status: AC
  Administered 2022-02-19: 30 mg via INTRAVENOUS
  Filled 2022-02-19: qty 1

## 2022-02-19 NOTE — ED Triage Notes (Signed)
Pt presents via POV with complaints of R sided flank pain that started 2 weeks ago - resolved and then returned this AM. Hx of kidney stones. Rates pain 7/10. Denies dysuria, fevers, chills, N/V/D, CP or SOB.

## 2022-02-19 NOTE — ED Provider Notes (Signed)
[facility] Provider Note    Event Date/Time   First MD Initiated Contact with Patient 02/19/22 [phone]     (approximate)   History   Flank Pain   HPI  Jesus Mason is a 22 y.o. male history of previous kidney stones, ADHD who presents to the emergency department complaints of intermittent right flank and lower abdominal pain for the past 2 weeks.  Has had nausea without vomiting.  No fever.  Has had some dysuria but no hematuria or discharge.  Feels similar to his previous kidney stones.   History provided by patient.    Past Medical History:  Diagnosis Date   ADHD (attention deficit hyperactivity disorder)    Renal disorder    kidney stones    Past Surgical History:  Procedure Laterality Date   ADENOIDECTOMY     TONSILLECTOMY     TYMPANOSTOMY TUBE PLACEMENT      MEDICATIONS:  Prior to Admission medications   Medication Sig Start Date End Date Taking? Authorizing Provider  acetaminophen-codeine (TYLENOL #3) 300-30 MG tablet Take 1 tablet by mouth every 6 (six) hours as needed for moderate pain. 01/13/15   Johnn Hai, PA-C  busPIRone (BUSPAR) 10 MG tablet Take 1 tablet by mouth daily.  09/03/14   [provider]  dexmethylphenidate (FOCALIN XR) 15 MG 24 hr capsule Take 15 mg by mouth daily.    [provider]  dexmethylphenidate (FOCALIN) 10 MG tablet Take 10 mg by mouth daily.    [provider]  dexmethylphenidate (FOCALIN) 5 MG tablet Take 5 mg by mouth daily.    [provider]  diazepam (VALIUM) 2 MG tablet Take 1 tablet (2 mg total) by mouth every 8 (eight) hours as needed for muscle spasms. 01/13/15   Johnn Hai, PA-C  HYDROcodone-acetaminophen (NORCO/VICODIN) 5-325 MG tablet Take 1 tablet by mouth every 6 (six) hours as needed for moderate pain. 09/29/19   Fisher, Linden Dolin, PA-C  ketorolac (TORADOL) 10 MG tablet Take 1 tablet (10 mg total) by mouth every 6 (six) hours as needed. 09/29/19    Fisher, Linden Dolin, PA-C  methocarbamol (ROBAXIN) 500 MG tablet Take 1 tablet (500 mg total) by mouth 4 (four) times daily. 09/20/20   Cuthriell, Charline Bills, PA-C  ondansetron (ZOFRAN ODT) 4 MG disintegrating tablet Take 1 tablet (4 mg total) by mouth every 6 (six) hours as needed for nausea or vomiting. 09/30/14   Delman Kitten, MD  traZODone (DESYREL) 100 MG tablet Take 100 mg by mouth at bedtime as needed for sleep.    [provider]    Physical Exam   Triage Vital Signs: ED Triage Vitals  Enc Vitals Group     BP 02/19/22 0521 131/80     Pulse Rate 02/19/22 0521 (!) 103     Resp 02/19/22 0521 20     Temp 02/19/22 0521 98.1 F (36.7 C)     Temp Source 02/19/22 0521 Oral     SpO2 02/19/22 0521 100 %     Weight 02/19/22 0519 196 lb (88.9 kg)     Height 02/19/22 0519 5\' 8"  (1.727 m)     Head Circumference --      Peak Flow --      Pain Score 02/19/22 0519 7     Pain Loc --      Pain Edu? --      Excl. in [place]? --     Most recent vital signs: Vitals:  02/19/22 0521  BP: 131/80  Pulse: (!) 103  Resp: 20  Temp: 98.1 F (36.7 C)  SpO2: 100%    CONSTITUTIONAL: Alert, responds appropriately to questions. Well-appearing; well-nourished HEAD: Normocephalic, atraumatic EYES: Conjunctivae clear, pupils appear equal, sclera nonicteric ENT: normal nose; moist mucous membranes NECK: Supple, normal ROM CARD: RRR; S1 and S2 appreciated RESP: Normal chest excursion without splinting or tachypnea; breath sounds clear and equal bilaterally; no wheezes, no rhonchi, no rales, no hypoxia or respiratory distress, speaking full sentences ABD/GI: Non-distended; soft, non-tender, no rebound, no guarding, no peritoneal signs BACK: The back appears normal EXT: Normal ROM in all joints; no deformity noted, no edema SKIN: Normal color for age and race; warm; no rash on exposed skin NEURO: Moves all extremities equally, normal speech PSYCH: The patient's mood and manner are appropriate.   ED  Results / Procedures / Treatments   LABS: (all labs ordered are listed, but only abnormal results are displayed) Labs Reviewed  COMPREHENSIVE METABOLIC PANEL - Abnormal; Notable for the following components:      Result Value   Glucose, Bld 107 (*)    All other components within normal limits  CBC WITH DIFFERENTIAL/PLATELET  URINALYSIS, ROUTINE W REFLEX MICROSCOPIC     EKG:   RADIOLOGY: My personal review and interpretation of imaging: CT scan shows 5 mm obstructive stone at the right UVJ.  I have personally reviewed all radiology reports.   CT Renal Stone Study  Result Date: 02/19/2022 CLINICAL DATA:  22 year old male with history of abdominal and flank pain. Suspected kidney stone. EXAM: CT ABDOMEN AND PELVIS WITHOUT CONTRAST TECHNIQUE: Multidetector CT imaging of the abdomen and pelvis was performed following the standard protocol without IV contrast. RADIATION DOSE REDUCTION: This exam was performed according to the departmental dose-optimization program which includes automated exposure control, adjustment of the mA and/or kV according to patient size and/or use of iterative reconstruction technique. COMPARISON:  CT of the abdomen and pelvis 09/20/2019. FINDINGS: Lower chest: Unremarkable. Hepatobiliary: No definite suspicious cystic or solid hepatic lesions are confidently identified on today's noncontrast CT examination. Unenhanced appearance of the gallbladder is normal. Pancreas: No definite pancreatic mass or peripancreatic fluid collections or inflammatory changes are noted on today's noncontrast CT examination. Spleen: Unremarkable. Adrenals/Urinary Tract: Multiple nonobstructive calculi are noted within the collecting systems of both kidneys measuring up to 3 mm. In addition, in the distal third of the right ureter at the level of the right ureterovesicular junction (axial image 73 of series 2) there is a 5 mm calculus. This is associated with mild proximal right  hydroureteronephrosis. No left ureteral stones or other bladder calculi are identified at this time. No left hydroureteronephrosis. Unenhanced appearance of the kidneys and bilateral adrenal glands is otherwise normal. Urinary bladder is nearly completely decompressed, but otherwise unremarkable in appearance. Stomach/Bowel: Unenhanced appearance of the stomach is normal. There is no pathologic dilatation of small bowel or colon. Normal appendix. Vascular/Lymphatic: No atherosclerotic calcifications in the abdominal aorta or pelvic vasculature. No lymphadenopathy noted in the abdomen or pelvis. Reproductive: Prostate gland and seminal vesicles are unremarkable in appearance. Other: No significant volume of ascites.  No pneumoperitoneum. Musculoskeletal: There are no aggressive appearing lytic or blastic lesions noted in the visualized portions of the skeleton. IMPRESSION: 1. 5 mm obstructive calculus at the right ureterovesicular junction with mild proximal right hydroureteronephrosis. 2. Multiple additional 2-3 mm nonobstructive calculi in the collecting systems of both kidneys. Electronically Signed   By: Brayton Mars.D.  On: 02/19/2022 06:17     PROCEDURES:  Critical Care performed: No     Procedures    IMPRESSION / MDM / ASSESSMENT AND PLAN / ED COURSE  I reviewed the triage vital signs and the nursing notes.    Patient here with right flank and lower abdominal pain that worsened tonight with nausea without vomiting.     DIFFERENTIAL DIAGNOSIS (includes but not limited to):   Kidney stone, UTI, pyelonephritis, appendicitis, colitis   Patient's presentation is most consistent with acute presentation with potential threat to life or bodily function.   PLAN: Will obtain CBC, CMP, urinalysis, CT renal study.  Will give IV fluids, pain and nausea medicine.   MEDICATIONS GIVEN IN ED: Medications  sodium chloride 0.9 % bolus 1,000 mL (1,000 mLs Intravenous New Bag/Given 02/19/22  0634)  ketorolac (TORADOL) 30 MG/ML injection 30 mg (30 mg Intravenous Given 02/19/22 0634)  ondansetron (ZOFRAN) injection 4 mg (4 mg Intravenous Given 02/19/22 0633)  morphine (PF) 4 MG/ML injection 4 mg (4 mg Intravenous Given 02/19/22 2706)     ED COURSE: Patient's labs unremarkable.  No leukocytosis, normal hemoglobin, normal renal function, LFTs and electrolytes.  Urine pending.  CT scan reviewed and interpreted by myself and the radiologist and shows 5 mm stone at the right UVJ with mild right hydroureteronephrosis.  Signed out to oncoming physician to follow-up on urinalysis.  Dissipate discharge home once pain is well-controlled.   CONSULTS:  none   OUTSIDE RECORDS REVIEWED: No previous records for review other than ED notes.       FINAL CLINICAL IMPRESSION(S) / ED DIAGNOSES   Final diagnoses:  Kidney stone     Rx / DC Orders   ED Discharge Orders     None        Note:  This document was prepared using Dragon voice recognition software and may include unintentional dictation errors.   Angle Karel, Delice Bison, DO 02/19/22 [phone]

## 2022-02-19 NOTE — ED Provider Notes (Signed)
-----------------------------------------   9:47 AM on 02/19/2022 ----------------------------------------- Patient's urinalysis is negative for infection.  Urine culture has been sent.  Patient will be discharged home with my typical kidney stone return precautions.   Harvest Dark, MD 02/19/22 [phone]

## 2022-02-20 LAB — URINE CULTURE

## 2022-02-28 DIAGNOSIS — Z419 Encounter for procedure for purposes other than remedying health state, unspecified: Secondary | ICD-10-CM | POA: Diagnosis not present

## 2022-03-29 DIAGNOSIS — Z419 Encounter for procedure for purposes other than remedying health state, unspecified: Secondary | ICD-10-CM | POA: Diagnosis not present

## 2022-04-29 DIAGNOSIS — Z419 Encounter for procedure for purposes other than remedying health state, unspecified: Secondary | ICD-10-CM | POA: Diagnosis not present

## 2022-05-29 DIAGNOSIS — Z419 Encounter for procedure for purposes other than remedying health state, unspecified: Secondary | ICD-10-CM | POA: Diagnosis not present

## 2022-06-29 DIAGNOSIS — Z419 Encounter for procedure for purposes other than remedying health state, unspecified: Secondary | ICD-10-CM | POA: Diagnosis not present

## 2022-07-29 DIAGNOSIS — Z419 Encounter for procedure for purposes other than remedying health state, unspecified: Secondary | ICD-10-CM | POA: Diagnosis not present

## 2022-08-29 DIAGNOSIS — Z419 Encounter for procedure for purposes other than remedying health state, unspecified: Secondary | ICD-10-CM | POA: Diagnosis not present

## 2022-08-30 ENCOUNTER — Other Ambulatory Visit: Payer: Self-pay

## 2022-08-30 ENCOUNTER — Emergency Department
Admission: EM | Admit: 2022-08-30 | Discharge: 2022-08-30 | Disposition: A | Discharge status: Home / Self Care | Payer: Medicaid Other | Attending: Emergency Medicine | Admitting: Emergency Medicine

## 2022-08-30 DIAGNOSIS — R42 Dizziness and giddiness: Secondary | ICD-10-CM | POA: Diagnosis not present

## 2022-08-30 DIAGNOSIS — I951 Orthostatic hypotension: Secondary | ICD-10-CM | POA: Diagnosis not present

## 2022-08-30 LAB — BASIC METABOLIC PANEL
Anion gap: 12 (ref 5–15)
BUN: 8 mg/dL (ref 6–20)
CO2: 23 mmol/L (ref 22–32)
Calcium: 9.6 mg/dL (ref 8.9–10.3)
Chloride: 102 mmol/L (ref 98–111)
Creatinine, Ser: 0.9 mg/dL (ref 0.61–1.24)
GFR, Estimated: >60 mL/min (ref >60)
Glucose, Bld: 102 mg/dL — ABNORMAL HIGH (ref 70–99)
Potassium: 3.8 mmol/L (ref 3.5–5.1)
Sodium: 137 mmol/L (ref 135–145)

## 2022-08-30 LAB — CBC
HCT: 55.6 % — ABNORMAL HIGH (ref 39.0–52.0)
Hemoglobin: 18.8 g/dL — ABNORMAL HIGH (ref 13.0–17.0)
MCH: 29.9 pg (ref 26.0–34.0)
MCHC: 33.8 g/dL (ref 30.0–36.0)
MCV: 88.4 fL (ref 80.0–100.0)
Platelets: 251 10*3/uL (ref 150–400)
RBC: 6.29 MIL/uL — ABNORMAL HIGH (ref 4.22–5.81)
RDW: 14 % (ref 11.5–15.5)
WBC: 9.5 10*3/uL (ref 4.0–10.5)
nRBC: 0 % (ref 0.0–0.2)

## 2022-08-30 LAB — URINALYSIS, ROUTINE W REFLEX MICROSCOPIC
Bilirubin Urine: NEGATIVE
Glucose, UA: NEGATIVE mg/dL
Hgb urine dipstick: NEGATIVE
Ketones, ur: 5 mg/dL — AB
Leukocytes,Ua: NEGATIVE
Nitrite: NEGATIVE
Protein, ur: NEGATIVE mg/dL
Specific Gravity, Urine: 1.003 — ABNORMAL LOW (ref 1.005–1.030)
pH: 7 (ref 5.0–8.0)

## 2022-08-30 NOTE — ED Provider Notes (Signed)
[facility] Provider Note   Event Date/Time   First MD Initiated Contact with Patient 08/30/22 1235     (approximate) History  Dizziness  HPI Jesus RUPPERT is a 22 y.o. male with no stated past medical history presents for orthostatic lightheadedness over the last 2 weeks after a dental procedure.  Patient states that he initially started having these symptoms after receiving a local injection of what he calls lidocaine during a dental procedure.  Patient states that he had orthostatic lightheadedness for the rest of the day that resolved spontaneously the next morning.  Patient states that in the days since then he has had multiple episodes of orthostatic lightheadedness as well as intermittent episodes of lightheadedness that occur spontaneously while he is just sitting doing nothing.  Patient denies any associated palpitations, shortness of breath, tinnitus ROS: Patient currently denies any vision changes, tinnitus, difficulty speaking, facial droop, sore throat, chest pain, shortness of breath, abdominal pain, nausea/vomiting/diarrhea, dysuria, or weakness/numbness/paresthesias in any extremity   Physical Exam  Triage Vital Signs: ED Triage Vitals  Encounter Vitals Group     BP 08/30/22 1229 128/85     Systolic BP Percentile --      Diastolic BP Percentile --      Pulse Rate 08/30/22 1229 77     Resp 08/30/22 1229 18     Temp 08/30/22 1229 99.1 F (37.3 C)     Temp Source 08/30/22 1229 Oral     SpO2 08/30/22 1229 99 %     Weight 08/30/22 1219 158 lb (71.7 kg)     Height 08/30/22 1219 5\' 8"  (1.727 m)     Head Circumference --      Peak Flow --      Pain Score 08/30/22 1219 0     Pain Loc --      Pain Education --      Exclude from Growth Chart --    Most recent vital signs: Vitals:   08/30/22 1229  BP: 128/85  Pulse: 77  Resp: 18  Temp: 99.1 F (37.3 C)  SpO2: 99%   General: Awake, oriented x4. CV:  Good peripheral perfusion.   Resp:  Normal effort.  Abd:  No distention.  Other:  Young adult Caucasian male laying in stretcher in no acute distress ED Results / Procedures / Treatments  Labs (all labs ordered are listed, but only abnormal results are displayed) Labs Reviewed  BASIC METABOLIC PANEL - Abnormal; Notable for the following components:      Result Value   Glucose, Bld 102 (*)    All other components within normal limits  CBC - Abnormal; Notable for the following components:   RBC 6.29 (*)    Hemoglobin 18.8 (*)    HCT 55.6 (*)    All other components within normal limits  URINALYSIS, ROUTINE W REFLEX MICROSCOPIC  CBG MONITORING, ED   EKG ED ECG REPORT I, Merwyn Katos, the attending physician, personally viewed and interpreted this ECG. Date: 08/30/2022 EKG Time: 1222 Rate: 79 Rhythm: normal sinus rhythm QRS Axis: normal Intervals: normal ST/T Wave abnormalities: normal Narrative Interpretation: no evidence of acute ischemia PROCEDURES: Critical Care performed: No Procedures MEDICATIONS ORDERED IN ED: Medications - No data to display IMPRESSION / MDM / ASSESSMENT AND PLAN / ED COURSE  I reviewed the triage vital signs and the nursing notes.  The patient is on the cardiac monitor to evaluate for evidence of arrhythmia and/or significant heart rate changes. Patient's presentation is most consistent with acute presentation with potential threat to life or bodily function. Patient presents with complaints of presyncope ED Workup:  CBC, BMP, Troponin, BNP, ECG Differential diagnosis includes HF, ICH, seizure, stroke, HOCM, ACS, aortic dissection, malignant arrhythmia, or GI bleed. Findings: No evidence of acute laboratory abnormalities.  Troponin negative x1 EKG: No e/o STEMI. No evidence of Brugadas sign, delta wave, epsilon wave, significantly prolonged QTc, or malignant arrhythmia.  Disposition: Discharge. Patient is at baseline at this time. Return  precautions expressed and understood in person. Advised follow up with primary care provider or clinic physician in next 24 hours.   FINAL CLINICAL IMPRESSION(S) / ED DIAGNOSES   Final diagnoses:  Orthostatic lightheadedness   Rx / DC Orders   ED Discharge Orders          Ordered    Ambulatory Referral to Primary Care (Establish Care)        08/30/22 1325           Note:  This document was prepared using Dragon voice recognition software and may include unintentional dictation errors.   Merwyn Katos, MD 08/30/22 [phone]

## 2022-08-30 NOTE — ED Triage Notes (Signed)
C/O lightheaded x 2-3 days.  Symptoms are intermittent.Laying down relieves symptoms.  States symptoms worse when sitting up from laying down.  AAOx3.  Skin warm and dry. NAD   Posture upright and relaxed.  Gait steady. MAE equally and strong.

## 2022-08-30 NOTE — ED Notes (Signed)
ED Provider at bedside. 

## 2022-09-29 DIAGNOSIS — Z419 Encounter for procedure for purposes other than remedying health state, unspecified: Secondary | ICD-10-CM | POA: Diagnosis not present

## 2022-10-14 ENCOUNTER — Encounter: Payer: Self-pay | Admitting: Nurse Practitioner

## 2022-10-14 ENCOUNTER — Other Ambulatory Visit (HOSPITAL_COMMUNITY)
Admission: RE | Admit: 2022-10-14 | Discharge: 2022-10-14 | Disposition: A | Discharge status: Home / Self Care | Payer: Medicaid Other | Source: Ambulatory Visit | Attending: Nurse Practitioner | Admitting: Nurse Practitioner

## 2022-10-14 ENCOUNTER — Encounter: Payer: Self-pay | Admitting: *Deleted

## 2022-10-14 ENCOUNTER — Ambulatory Visit (INDEPENDENT_AMBULATORY_CARE_PROVIDER_SITE_OTHER): Payer: Medicaid Other | Admitting: Nurse Practitioner

## 2022-10-14 VITALS — BP 110/82 | HR 65 | Temp 97.6°F | Ht 66.05 in | Wt 169.2 lb

## 2022-10-14 DIAGNOSIS — R41840 Attention and concentration deficit: Secondary | ICD-10-CM | POA: Diagnosis not present

## 2022-10-14 DIAGNOSIS — Z113 Encounter for screening for infections with a predominantly sexual mode of transmission: Secondary | ICD-10-CM

## 2022-10-14 DIAGNOSIS — F411 Generalized anxiety disorder: Secondary | ICD-10-CM | POA: Insufficient documentation

## 2022-10-14 DIAGNOSIS — Z13220 Encounter for screening for lipoid disorders: Secondary | ICD-10-CM | POA: Diagnosis not present

## 2022-10-14 DIAGNOSIS — L659 Nonscarring hair loss, unspecified: Secondary | ICD-10-CM

## 2022-10-14 DIAGNOSIS — Z114 Encounter for screening for human immunodeficiency virus [HIV]: Secondary | ICD-10-CM | POA: Diagnosis not present

## 2022-10-14 DIAGNOSIS — K649 Unspecified hemorrhoids: Secondary | ICD-10-CM | POA: Insufficient documentation

## 2022-10-14 DIAGNOSIS — Z0000 Encounter for general adult medical examination without abnormal findings: Secondary | ICD-10-CM | POA: Diagnosis not present

## 2022-10-14 DIAGNOSIS — Z1159 Encounter for screening for other viral diseases: Secondary | ICD-10-CM | POA: Diagnosis not present

## 2022-10-14 LAB — CBC
HCT: 50.7 % (ref 39.0–52.0)
Hemoglobin: 16.4 g/dL (ref 13.0–17.0)
MCHC: 32.4 g/dL (ref 30.0–36.0)
MCV: 92.3 fl (ref 78.0–100.0)
Platelets: 197 10*3/uL (ref 150.0–400.0)
RBC: 5.5 Mil/uL (ref 4.22–5.81)
RDW: 14 % (ref 11.5–15.5)
WBC: 8.9 10*3/uL (ref 4.0–10.5)

## 2022-10-14 LAB — COMPREHENSIVE METABOLIC PANEL
ALT: 10 U/L (ref 0–53)
AST: 13 U/L (ref 0–37)
Albumin: 4.1 g/dL (ref 3.5–5.2)
Alkaline Phosphatase: 61 U/L (ref 39–117)
BUN: 10 mg/dL (ref 6–23)
CO2: 30 meq/L (ref 19–32)
Calcium: 9.2 mg/dL (ref 8.4–10.5)
Chloride: 105 meq/L (ref 96–112)
Creatinine, Ser: 0.96 mg/dL (ref 0.40–1.50)
GFR: 112.6 mL/min (ref >60.00)
Glucose, Bld: 76 mg/dL (ref 70–99)
Potassium: 4.1 meq/L (ref 3.5–5.1)
Sodium: 142 meq/L (ref 135–145)
Total Bilirubin: 0.4 mg/dL (ref 0.2–1.2)
Total Protein: 5.9 g/dL — ABNORMAL LOW (ref 6.0–8.3)

## 2022-10-14 LAB — LIPID PANEL
Cholesterol: 141 mg/dL (ref 0–200)
HDL: 54.1 mg/dL (ref >39.00)
LDL Cholesterol: 53 mg/dL (ref 0–99)
NonHDL: 86.79
Total CHOL/HDL Ratio: 3
Triglycerides: 167 mg/dL — ABNORMAL HIGH (ref 0.0–149.0)
VLDL: 33.4 mg/dL (ref 0.0–40.0)

## 2022-10-14 LAB — TSH: TSH: 0.61 u[IU]/mL (ref 0.35–5.50)

## 2022-10-14 MED ORDER — HYDROCORTISONE ACETATE 25 MG RE SUPP: 25.0000 mg | Freq: Two times a day (BID) | RECTAL | 0 refills | Status: DC

## 2022-10-14 MED ORDER — BUSPIRONE HCL 5 MG PO TABS: 5.0000 mg | ORAL_TABLET | Freq: Two times a day (BID) | ORAL | 1 refills | Status: DC

## 2022-10-14 NOTE — Patient Instructions (Signed)
Nice to see you today I will be in touch with the labs once I have reviewed them Follow up with me in 6 weeks to see how the buspar medicine is doing

## 2022-10-14 NOTE — Progress Notes (Signed)
New Patient Office Visit  Subjective    Patient ID: Jesus Mason, male    DOB: [birth year]  Age: 22 y.o. MRN: [id number]  CC:  Chief Complaint  Patient presents with   Establish Care    Yes to flu shot. Pt complains of lump in anal cavity. Would like evaluated.     HPI Jesus Mason presents to establish care  GAD: states that he has been off the medicaiton for over 10 years. States that he felt like it helped. States that he does have ADD and it helped. States that he has trouble sleeping. States he does have trouble cutting mind off to go to bed. States that he has been dreaming of his job as of late.  for complete physical and follow up of chronic conditions.  Immunizations: -Tetanus: Completed in 2023 -Influenza: intermittently -Shingles: Too young -Pneumonia: Too young  Diet: Fair diet. 2 meals a day sometimes 3. States some snacks. States that he will drink water and once soda to two a day with lunch. Exercise: Employment   Eye exam:  States needs updating . Glasses  Dental exam:  not routine care but emergency care   Colonoscopy: Too young, currently average risk Lung Cancer Screening: N/A  PSA: Too young, currently average risk patient has not know paternal history  States that he has a lump on the inside of his bottom. States that it has been present for a month. States that he had a history of hemmorrhoids. States that he was applying cream. States that he is not having blood in stool or constipation. Last BM today and BM twice a day      Outpatient Encounter Medications as of 10/14/2022  Medication Sig   acetaminophen-codeine (TYLENOL #3) 300-30 MG tablet Take 1 tablet by mouth every 6 (six) hours as needed for moderate pain.   busPIRone (BUSPAR) 5 MG tablet Take 1 tablet (5 mg total) by mouth 2 (two) times daily.   hydrocortisone (ANUSOL-HC) 25 MG suppository Place 1 suppository (25 mg total) rectally 2 (two) times daily.   ketorolac  (TORADOL) 10 MG tablet Take 1 tablet (10 mg total) by mouth every 6 (six) hours as needed.   ondansetron (ZOFRAN-ODT) 4 MG disintegrating tablet Take 1 tablet (4 mg total) by mouth every 8 (eight) hours as needed for nausea or vomiting.   tamsulosin (FLOMAX) 0.4 MG CAPS capsule Take 1 capsule (0.4 mg total) by mouth daily.   dexmethylphenidate (FOCALIN XR) 15 MG 24 hr capsule Take 15 mg by mouth daily.   dexmethylphenidate (FOCALIN) 10 MG tablet Take 10 mg by mouth daily. (Patient not taking: Reported on 10/14/2022)   dexmethylphenidate (FOCALIN) 5 MG tablet Take 5 mg by mouth daily. (Patient not taking: Reported on 10/14/2022)   diazepam (VALIUM) 2 MG tablet Take 1 tablet (2 mg total) by mouth every 8 (eight) hours as needed for muscle spasms.   traZODone (DESYREL) 100 MG tablet Take 100 mg by mouth at bedtime as needed for sleep.   [DISCONTINUED] busPIRone (BUSPAR) 10 MG tablet Take 1 tablet by mouth daily.    [DISCONTINUED] HYDROcodone-acetaminophen (NORCO/VICODIN) 5-325 MG tablet Take 1 tablet by mouth every 6 (six) hours as needed for moderate pain.   [DISCONTINUED] methocarbamol (ROBAXIN) 500 MG tablet Take 1 tablet (500 mg total) by mouth 4 (four) times daily.   [DISCONTINUED] oxyCODONE-acetaminophen (PERCOCET) 5-325 MG tablet Take 1 tablet by mouth every 4 (four) hours as needed for severe pain.   No facility-administered  encounter medications on file as of 10/14/2022.    Past Medical History:  Diagnosis Date   ADHD (attention deficit hyperactivity disorder)    Anxiety    Kidney stones    Renal disorder    kidney stones    Past Surgical History:  Procedure Laterality Date   ADENOIDECTOMY     TONSILLECTOMY     TYMPANOSTOMY TUBE PLACEMENT      Family History  Problem Relation Age of Onset   Breast cancer Maternal Grandmother    Diabetes Paternal Grandmother    Diabetes Paternal Grandfather     Social History   Socioeconomic History   Marital status: Single    Spouse  name: Not on file   Number of children: 0   Years of education: Not on file   Highest education level: Not on file  Occupational History   Not on file  Tobacco Use   Smoking status: Some Days    Types: Cigars   Smokeless tobacco: Never   Tobacco comments:    Occasional cigar.   Vaping Use   Vaping status: Every Day   Substances: Nicotine, Flavoring  Substance and Sexual Activity   Alcohol use: No    Comment: occasional   Drug use: Not Currently    Types: Marijuana   Sexual activity: Not on file  Other Topics Concern   Not on file  Social History Narrative   Fulltime: [employer]      Hobbies: computers    Social Determinants of Corporate investment banker Strain: Not on file  Food Insecurity: Not on file  Transportation Needs: Not on file  Physical Activity: Not on file  Stress: Not on file  Social Connections: Not on file  Intimate Partner Violence: Not on file    Review of Systems  Constitutional:  Negative for chills and fever.  Respiratory:  Negative for shortness of breath.   Cardiovascular:  Negative for chest pain and leg swelling.  Gastrointestinal:  Negative for abdominal pain, blood in stool, constipation, diarrhea, nausea and vomiting.  Genitourinary:  Negative for dysuria and hematuria.  Neurological:  Positive for tingling. Negative for headaches.  Psychiatric/Behavioral:  Negative for hallucinations and suicidal ideas.         Objective    BP 110/82   Pulse 65   Temp 97.6 F (36.4 C) (Temporal)   Ht 5' 6.05" (1.678 m)   Wt 169 lb 3.2 oz (76.7 kg)   SpO2 99%   BMI 27.27 kg/m   Physical Exam Vitals and nursing note reviewed. Exam conducted with a chaperone present Melina Copa, CMA).  Constitutional:      Appearance: Normal appearance.  HENT:     Right Ear: Tympanic membrane, ear canal and external ear normal.     Left Ear: Tympanic membrane, ear canal and external ear normal.     Mouth/Throat:     Mouth: Mucous membranes are  moist.     Pharynx: Oropharynx is clear.  Eyes:     Extraocular Movements: Extraocular movements intact.     Pupils: Pupils are equal, round, and reactive to light.  Cardiovascular:     Rate and Rhythm: Normal rate and regular rhythm.     Pulses: Normal pulses.     Heart sounds: Normal heart sounds.  Pulmonary:     Effort: Pulmonary effort is normal.     Breath sounds: Normal breath sounds.  Abdominal:     General: Bowel sounds are normal. There is no distension.  Palpations: There is no mass.     Tenderness: There is no abdominal tenderness.     Hernia: No hernia is present.  Genitourinary:    Comments: Pea sized internal lump on the right lateral inner rim of anus. Musculoskeletal:     Right lower leg: No edema.     Left lower leg: No edema.  Lymphadenopathy:     Cervical: No cervical adenopathy.  Skin:    General: Skin is warm.  Neurological:     General: No focal deficit present.     Mental Status: He is alert.     Deep Tendon Reflexes:     Reflex Scores:      Bicep reflexes are 2+ on the right side and 2+ on the left side.      Patellar reflexes are 2+ on the right side and 2+ on the left side.    Comments: Bilateral upper and lower extremity strength 5/5  Psychiatric:        Mood and Affect: Mood normal.        Behavior: Behavior normal.        Thought Content: Thought content normal.        Judgment: Judgment normal.         Assessment & Plan:   Problem List Items Addressed This Visit       Cardiovascular and Mediastinum   Hemorrhoids    History of the same will do suppositories if no improvement GI referral      Relevant Medications   hydrocortisone (ANUSOL-HC) 25 MG suppository     Other   Preventative health care - Primary    Discussed age-appropriate immunizations and screening exams.  Did review patient's personal, surgical, social, family histories.  Patient is up-to-date on all age-appropriate vaccinations he would like.  He will get his  flu vaccine at local pharmacy or health department.  Patient is too young for CRC screening or prostate cancer screening.  Will screen routinely for STIs today.  Patient was given information at discharge about preventative healthcare maintenance with anticipatory guidance.      Relevant Orders   CBC   Comprehensive metabolic panel   TSH   GAD (generalized anxiety disorder)    History of same was on buspirone in the past.  Patient would like to go back on medication BuSpar 5 mg twice daily started.  Follow-up 6 weeks      Relevant Medications   busPIRone (BUSPAR) 5 MG tablet   Concentration deficit    History of being on Focalin in the past.  Patient states has been many years since his overmedication.  Was diagnosed as a kid.  Will have patient reevaluated to make sure this is appropriate therapy ambulatory referral to psychology placed today for testing      Relevant Orders   Ambulatory referral to Psychology   Other Visit Diagnoses     Encounter for hepatitis C screening test for low risk patient       Relevant Orders   Hepatitis C Antibody   Encounter for screening for HIV       Relevant Orders   HIV antibody (with reflex)   Screening for lipid disorders       Relevant Orders   Lipid panel   Screening examination for STI       Relevant Orders   HSV(herpes simplex vrs) 1+2 ab-IgG   RPR   Urine cytology ancillary only       Return in about 6  weeks (around 11/25/2022) for GAD, rectal bump.   Audria Nine, NP

## 2022-10-14 NOTE — Assessment & Plan Note (Signed)
History of being on Focalin in the past.  Patient states has been many years since his overmedication.  Was diagnosed as a kid.  Will have patient reevaluated to make sure this is appropriate therapy ambulatory referral to psychology placed today for testing

## 2022-10-14 NOTE — Assessment & Plan Note (Signed)
History of same was on buspirone in the past.  Patient would like to go back on medication BuSpar 5 mg twice daily started.  Follow-up 6 weeks

## 2022-10-14 NOTE — Assessment & Plan Note (Signed)
History of the same will do suppositories if no improvement GI referral

## 2022-10-14 NOTE — Assessment & Plan Note (Signed)
Discussed age-appropriate immunizations and screening exams.  Did review patient's personal, surgical, social, family histories.  Patient is up-to-date on all age-appropriate vaccinations he would like.  He will get his flu vaccine at local pharmacy or health department.  Patient is too young for CRC screening or prostate cancer screening.  Will screen routinely for STIs today.  Patient was given information at discharge about preventative healthcare maintenance with anticipatory guidance.

## 2022-10-15 LAB — RPR: RPR Ser Ql: NONREACTIVE

## 2022-10-15 LAB — URINE CYTOLOGY ANCILLARY ONLY
Chlamydia: NEGATIVE
Comment: NEGATIVE
Comment: NEGATIVE
Comment: NORMAL
Neisseria Gonorrhea: NEGATIVE
Trichomonas: NEGATIVE

## 2022-10-15 LAB — HSV(HERPES SIMPLEX VRS) I + II AB-IGG
HSV 1 IGG,TYPE SPECIFIC AB: <0.9 {index}
HSV 2 IGG,TYPE SPECIFIC AB: <0.9 {index}

## 2022-10-15 LAB — HIV ANTIBODY (ROUTINE TESTING W REFLEX): HIV 1&2 Ab, 4th Generation: NONREACTIVE

## 2022-10-15 LAB — HEPATITIS C ANTIBODY: Hepatitis C Ab: NONREACTIVE

## 2022-10-15 MED ORDER — HYDROCORTISONE ACETATE 25 MG RE SUPP: 25.0000 mg | Freq: Two times a day (BID) | RECTAL | 0 refills | Status: DC

## 2022-10-15 MED ORDER — HYDROCORTISONE ACE-PRAMOXINE 25-18 MG RE SUPP: 1.0000 | Freq: Two times a day (BID) | RECTAL | 0 refills | Status: DC

## 2022-10-15 NOTE — Addendum Note (Signed)
Addended by: Eden Emms on: 10/15/2022 09:06 AM   Modules accepted: Orders

## 2022-10-15 NOTE — Addendum Note (Signed)
Addended by: Eden Emms on: 10/15/2022 11:45 AM   Modules accepted: Orders

## 2022-10-16 ENCOUNTER — Telehealth: Payer: Self-pay

## 2022-10-16 NOTE — Telephone Encounter (Signed)
[facility] states medication is not covered. PA needed.   Hydrocortisone Acetate 25mg  / Pramoxine hydrochloride 18 MG suppository.

## 2022-10-16 NOTE — Telephone Encounter (Signed)
Can we call the pharmacy and ask which suppository is covered with out a PA?

## 2022-10-17 NOTE — Telephone Encounter (Signed)
Contacted the pharmacy. Pharmacy stated that there is no suppository that will cover without PA. States that most insurances will not accept it.

## 2022-10-20 ENCOUNTER — Emergency Department
Admission: EM | Admit: 2022-10-20 | Discharge: 2022-10-20 | Disposition: A | Discharge status: Home / Self Care | Payer: Medicaid Other | Attending: Emergency Medicine | Admitting: Emergency Medicine

## 2022-10-20 ENCOUNTER — Other Ambulatory Visit: Payer: Self-pay

## 2022-10-20 ENCOUNTER — Emergency Department: Payer: Medicaid Other

## 2022-10-20 DIAGNOSIS — M25571 Pain in right ankle and joints of right foot: Secondary | ICD-10-CM | POA: Insufficient documentation

## 2022-10-20 NOTE — ED Provider Notes (Signed)
[facility] Provider Note    Event Date/Time   First MD Initiated Contact with Patient 10/20/22 1337     (approximate)   History   Ankle Injury (right)   HPI  Jesus Mason is a 22 y.o. male who presents today for evaluation of right ankle pain.  Patient reports that he has pain with walking, and also when he is pushing the gas pedal.  He has not had any obvious injuries.  He has not noticed any swelling or skin changes.  No other complaints today..  Patient Active Problem List   Diagnosis Date Noted   Hemorrhoids 10/14/2022   Preventative health care 10/14/2022   GAD (generalized anxiety disorder) 10/14/2022   Concentration deficit 10/14/2022          Physical Exam   Triage Vital Signs: ED Triage Vitals  Encounter Vitals Group     BP 10/20/22 1331 107/66     Systolic BP Percentile --      Diastolic BP Percentile --      Pulse Rate 10/20/22 1331 70     Resp 10/20/22 1331 16     Temp 10/20/22 1331 98.4 F (36.9 C)     Temp Source 10/20/22 1331 Oral     SpO2 10/20/22 1331 98 %     Weight 10/20/22 1328 169 lb (76.7 kg)     Height 10/20/22 1328 5\' 6"  (1.676 m)     Head Circumference --      Peak Flow --      Pain Score 10/20/22 1328 2     Pain Loc --      Pain Education --      Exclude from Growth Chart --     Most recent vital signs: Vitals:   10/20/22 1331  BP: 107/66  Pulse: 70  Resp: 16  Temp: 98.4 F (36.9 C)  SpO2: 98%    Physical Exam Vitals and nursing note reviewed.  Constitutional:      General: Awake and alert. No acute distress.    Appearance: Normal appearance. The patient is normal weight.  HENT:     Head: Normocephalic and atraumatic.     Mouth: Mucous membranes are moist.  Eyes:     General: PERRL. Normal EOMs        Right eye: No discharge.        Left eye: No discharge.     Conjunctiva/sclera: Conjunctivae normal.  Cardiovascular:     Rate and Rhythm: Normal rate and regular rhythm.      Pulses: Normal pulses.     Heart sounds: Normal heart sounds Pulmonary:     Effort: Pulmonary effort is normal. No respiratory distress.     Breath sounds: Normal breath sounds.  Abdominal:     Abdomen is soft. There is no abdominal tenderness. No rebound or guarding. No distention. Musculoskeletal:        General: No swelling. Normal range of motion.     Cervical back: Normal range of motion and neck supple.  Right ankle: Normal-appearing foot/ankle.  No erythema, swelling, ecchymosis, or other skin changes noted.  No lateral or medial malleolar tenderness or proximal fifth metacarpal tenderness. No proximal fibular tenderness. 2+ pedal pulses with brisk capillary refill. Intact distal sensation and strength with normal ROM. Able to plantar flex and dorsiflex against resistance. Able to invert and evert against resistance. Negative dorsiflexion external rotation test. Negative squeeze test. Negative Thompson test Skin:    General: Skin is  warm and dry.     Capillary Refill: Capillary refill takes less than 2 seconds.     Findings: No rash.  Neurological:     Mental Status: The patient is awake and alert.      ED Results / Procedures / Treatments   Labs (all labs ordered are listed, but only abnormal results are displayed) Labs Reviewed - No data to display   EKG     RADIOLOGY I independently reviewed and interpreted imaging and agree with radiologists findings.     PROCEDURES:  Critical Care performed:   Procedures   MEDICATIONS ORDERED IN ED: Medications - No data to display   IMPRESSION / MDM / ASSESSMENT AND PLAN / ED COURSE  I reviewed the triage vital signs and the nursing notes.   Differential diagnosis includes, but is not limited to, tendinitis, sprain, less likely fracture or dislocation.  Patient is awake and alert, hemodynamically stable and neurovascularly intact.  X-ray was obtained in triage.  There is no evidence of fracture on x-ray.  There is  no tenderness to proximal fibula that would be concerning for occult fracture.  There is no knee pain or swelling and no ligamental laxity, do not suspect knee injury.  There is no proximal fifth metatarsal tenderness concerning for Jones fracture.  Negative squeeze test so do not suspect high ankle sprain.  Negative Thompson test, do not suspect Achilles tendon rupture. Patient is able to bear weight with pain.  Patient was given Ace wrap for extra support as he notices it most often when he is driving.  He was advised not to drive if he has pain with this motion given risk of injury/death if he is in a car accident.  We discussed [place] and orthopedic follow-up.  Patient was treated symptomatically in the emergency department with improvement of symptoms.  We discussed no sports until ankle heals.  Patient understands and agrees with plan.  He requested a work note which was provided.   Patient's presentation is most consistent with acute complicated illness / injury requiring diagnostic workup.      FINAL CLINICAL IMPRESSION(S) / ED DIAGNOSES   Final diagnoses:  Acute right ankle pain     Rx / DC Orders   ED Discharge Orders     None        Note:  This document was prepared using Dragon voice recognition software and may include unintentional dictation errors.   Keturah Shavers 10/20/22 1442    Chesley Noon, MD 10/22/22 [phone]

## 2022-10-20 NOTE — ED Notes (Signed)
Pr right ankle wrapped in ace wrap. Pt d/c home per MD order. Discharge summary reviewed, pt verbalizes understanding. Ambulatory. No s/s of acute distress noted at discharge.

## 2022-10-20 NOTE — Discharge Instructions (Signed)
You may wear the Ace wrap while you are ambulating or using your ankle, you may remove it at night.  Please return for any new, worsening, or change in symptoms or other concerns.  It was a pleasure caring for you today.

## 2022-10-20 NOTE — ED Triage Notes (Signed)
Pt to ED via POV c/o right ankle injury. Reports injured his right ankle at work 2 days ago, pt thinks he has a sprain. Ambulatory in triage. No obvious injury noted.

## 2022-10-20 NOTE — ED Notes (Signed)
Patient ambulated to ED room with a steady gait.

## 2022-10-23 ENCOUNTER — Telehealth: Payer: Self-pay

## 2022-10-23 ENCOUNTER — Other Ambulatory Visit (HOSPITAL_COMMUNITY): Payer: Self-pay

## 2022-10-23 NOTE — Telephone Encounter (Signed)
Pharmacy Patient Advocate Encounter   Received notification from Pt Calls Messages that prior authorization for Hydrocortisone Ace-Pramoxine 25-18MG  suppositories is required/requested.   Insurance verification completed.   The patient is insured through [facility] .   Per test claim: PA required; PA submitted to [facility] via CoverMyMeds Key/confirmation #/EOC [id number] Status is pending

## 2022-10-23 NOTE — Telephone Encounter (Signed)
PA request has been Submitted. New Encounter created for follow up. For additional info see Pharmacy Prior Auth telephone encounter from 10/23/2022.

## 2022-10-24 NOTE — Telephone Encounter (Signed)
Pharmacy Patient Advocate Encounter  Received notification from [facility] that Prior Authorization for Hydrocortisone Ace-Pramoxine 25-18MG  suppositories has been  DENIED.  See denial reason below. No denial letter attached in CMM. Will attach denial letter to Media tab once received.   Per medicaid's preferred drugs list, no suppositories for hemorrhoids are covered.   PA #/Case ID/Reference #: [id number]

## 2022-10-24 NOTE — Telephone Encounter (Signed)
Did anyone every call the pharmacy and see if there is a covered suppository for his insurance?

## 2022-10-24 NOTE — Telephone Encounter (Signed)
Yes. Pharmacy staff stated that there are no suppositories that will covered through insurance.

## 2022-10-29 DIAGNOSIS — Z419 Encounter for procedure for purposes other than remedying health state, unspecified: Secondary | ICD-10-CM | POA: Diagnosis not present

## 2022-11-25 ENCOUNTER — Ambulatory Visit (INDEPENDENT_AMBULATORY_CARE_PROVIDER_SITE_OTHER): Payer: Medicaid Other | Admitting: Nurse Practitioner

## 2022-11-25 ENCOUNTER — Encounter: Payer: Self-pay | Admitting: Nurse Practitioner

## 2022-11-25 VITALS — BP 110/62 | HR 64 | Temp 98.1°F | Ht 66.0 in | Wt 186.0 lb

## 2022-11-25 DIAGNOSIS — Z2981 Encounter for HIV pre-exposure prophylaxis: Secondary | ICD-10-CM | POA: Diagnosis not present

## 2022-11-25 DIAGNOSIS — K6289 Other specified diseases of anus and rectum: Secondary | ICD-10-CM | POA: Diagnosis not present

## 2022-11-25 DIAGNOSIS — F411 Generalized anxiety disorder: Secondary | ICD-10-CM

## 2022-11-25 MED ORDER — BUSPIRONE HCL 5 MG PO TABS: 5.0000 mg | ORAL_TABLET | Freq: Three times a day (TID) | ORAL | 5 refills | Status: AC

## 2022-11-25 NOTE — Assessment & Plan Note (Signed)
Patient is some improvement with BuSpar 5 mg twice daily.  Will increase to buspirone 5 mg 3 times daily.  Patient has HI/SI/AVH.

## 2022-11-25 NOTE — Progress Notes (Signed)
Established Patient Office Visit  Subjective   Patient ID: Jesus Mason, male    DOB: [birth year]  Age: 22 y.o. MRN: [id number]  Chief Complaint  Patient presents with   Follow-up    Pt complains of still having lump. States it has gotten a little bigger.    HPI  GAD: patient was placed on buspar 5mg  BId during last office visit. He was on this medication in the past Staes that he feels like it has made it better. States that he worries about workthe next day. 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 Staets that somedays he may not have to go in at all. States takes 30- to go to sleep. States that his irriatbality has improved  Rectal bump: favored to be a hemorrhoids. Tried suppositories but none were covered by the patients insurance. Staets that he was able to get suppository  and did tow rounds per his report and it idid not help. States that he did check and he felt like it was getting. States that he does not have pain, difficlty passing stoo or blood in his stool     Review of Systems  Constitutional:  Negative for chills and fever.  Respiratory:  Negative for shortness of breath.   Cardiovascular:  Negative for chest pain.  Gastrointestinal:  Negative for abdominal pain, blood in stool, constipation, nausea and vomiting.  Neurological:  Negative for headaches.  Psychiatric/Behavioral:  Negative for hallucinations and suicidal ideas. The patient has insomnia.       Objective:     BP 110/62   Pulse 64   Temp 98.1 F (36.7 C) (Oral)   Ht 5\' 6"  (1.676 m)   Wt 186 lb (84.4 kg)   SpO2 95%   BMI 30.02 kg/m  BP Readings from Last 3 Encounters:  11/25/22 110/62  10/20/22 107/66  10/14/22 110/82   Wt Readings from Last 3 Encounters:  11/25/22 186 lb (84.4 kg)  10/20/22 169 lb (76.7 kg)  10/14/22 169 lb 3.2 oz (76.7 kg)   SpO2 Readings from Last 3 Encounters:  11/25/22 95%  10/20/22 98%  10/14/22 99%       Physical Exam Vitals and nursing note reviewed.  Constitutional:      Appearance: Normal appearance.  Cardiovascular:     Rate and Rhythm: Normal rate and regular rhythm.     Heart sounds: Normal heart sounds.  Pulmonary:     Effort: Pulmonary effort is normal.     Breath sounds: Normal breath sounds.  Neurological:     Mental Status: He is alert.      No results found for any visits on 11/25/22.    The ASCVD Risk score (Arnett DK, et al., 2019) failed to calculate for the following reasons:   The 2019 ASCVD risk score is only valid for ages 75 to 84    Assessment & Plan:   Problem List Items Addressed This Visit       Other   GAD (generalized anxiety disorder)    Patient is some improvement with BuSpar 5 mg twice daily.  Will increase to buspirone 5 mg 3 times daily.  Patient has HI/SI/AVH.      Relevant Medications   busPIRone (BUSPAR) 5 MG tablet   Rectal mass - Primary    Patient did 2 rounds of the suppositories without relief.  Will refer patient to the GI specialist.  Ambulatory referral placed.  Did give  signs and symptoms with patient to be reevaluated      Relevant Orders   Ambulatory referral to Gastroenterology   Encounter for HIV pre-exposure prophylaxis    Patient was interested in PrEP therapy.  Ambulatory furl to infectious disease.  Did encourage patient to get HPV vaccines at local pharmacy or health department.      Relevant Orders   Ambulatory referral to Infectious Disease    Return in about 1 year (around 11/25/2023) for CPE and Labs.    Audria Nine, NP

## 2022-11-25 NOTE — Patient Instructions (Signed)
Nice to see you today I have made the referrals Follow up with me in 1 year, sooner if you need me  Get the HPV vaccines at the local health department or check with the local pharmacy

## 2022-11-25 NOTE — Assessment & Plan Note (Signed)
Patient did 2 rounds of the suppositories without relief.  Will refer patient to the GI specialist.  Ambulatory referral placed.  Did give signs and symptoms with patient to be reevaluated

## 2022-11-25 NOTE — Assessment & Plan Note (Signed)
Patient was interested in PrEP therapy.  Ambulatory furl to infectious disease.  Did encourage patient to get HPV vaccines at local pharmacy or health department.

## 2022-11-29 DIAGNOSIS — Z419 Encounter for procedure for purposes other than remedying health state, unspecified: Secondary | ICD-10-CM | POA: Diagnosis not present

## 2022-12-01 NOTE — Progress Notes (Unsigned)
   HPI: Jesus Mason is a 22 y.o. male who presents to the [facility] pharmacy clinic to discuss and initiate PrEP.  Insured   []    Uninsured  []    Patient Active Problem List   Diagnosis Date Noted   Rectal mass 11/25/2022   Encounter for HIV pre-exposure prophylaxis 11/25/2022   Hemorrhoids 10/14/2022   Preventative health care 10/14/2022   GAD (generalized anxiety disorder) 10/14/2022   Concentration deficit 10/14/2022    Patient's Medications  New Prescriptions   No medications on file  Previous Medications   BUSPIRONE (BUSPAR) 5 MG TABLET    Take 1 tablet (5 mg total) by mouth 3 (three) times daily.   HYDROCORTISONE ACE-PRAMOXINE 25-18 MG SUPP    Place 1 suppository rectally in the morning and at bedtime.   KETOROLAC (TORADOL) 10 MG TABLET    Take 1 tablet (10 mg total) by mouth every 6 (six) hours as needed.   ONDANSETRON (ZOFRAN-ODT) 4 MG DISINTEGRATING TABLET    Take 1 tablet (4 mg total) by mouth every 8 (eight) hours as needed for nausea or vomiting.   TAMSULOSIN (FLOMAX) 0.4 MG CAPS CAPSULE    Take 1 capsule (0.4 mg total) by mouth daily.  Modified Medications   No medications on file  Discontinued Medications   No medications on file        No data to display          Labs:  SCr: Lab Results  Component Value Date   CREATININE 0.96 10/14/2022   CREATININE 0.90 08/30/2022   CREATININE 1.08 02/19/2022   CREATININE 1.25 (H) 09/20/2019   CREATININE 0.90 05/14/2019   HIV Lab Results  Component Value Date   HIV NON-REACTIVE 10/14/2022   Hepatitis B No results found for: "HEPBSAB", "HEPBSAG", "HEPBCAB" Hepatitis C Lab Results  Component Value Date   HEPCAB NON-REACTIVE 10/14/2022   Hepatitis A No results found for: "HAV" RPR and STI Lab Results  Component Value Date   LABRPR NON-REACTIVE 10/14/2022    STI Results GC CT  10/14/2022 10:30 AM Negative  Negative     Assessment: Jesus Mason presents today to discuss initiation of HIV PrEP. He was  referred by his PCP after expressing interest. ***. Last STI testing via Epic was on 10/14/22, and was negative. Due for immunizations: COVID, influenza, (?HPV series).  Discussed all options of PrEP, including Truvada, Descovy, and Apretude. ***  Plan: - HIV RNA, HIV antibody, Lipids, hepatitis A and B testing - Initiate *** - Follow up with Aggie Cosier, PharmD, on *** - Call with any questions or concerns  Lora Paula, PharmD PGY-2 Infectious Diseases Pharmacy Resident 12/01/2022 10:00 PM

## 2022-12-02 ENCOUNTER — Ambulatory Visit: Payer: Medicaid Other | Admitting: Pharmacist

## 2022-12-02 ENCOUNTER — Other Ambulatory Visit (HOSPITAL_COMMUNITY): Payer: Self-pay

## 2022-12-02 ENCOUNTER — Telehealth: Payer: Self-pay

## 2022-12-02 NOTE — Telephone Encounter (Signed)
[facility] Patient Advocate Encounter  Insurance verification completed.    The patient is insured through  [employer] ([employer]ICA[employer]) . Patient has Medicare and is not eligible for a copay card, but may be able to apply for patient assistance, if available.    Ran test claim for DESCOVY,TRUVADA,APRETUDE   The current 30 day co-pay is $0.  We will continue to follow to see if copay assistance is needed.  This test claim was processed through [facility]- copay amounts may vary at other pharmacies due to pharmacy/plan contracts, or as the patient moves through the different stages of their insurance plan.

## 2022-12-03 ENCOUNTER — Telehealth: Payer: Self-pay | Admitting: Nurse Practitioner

## 2022-12-03 NOTE — Telephone Encounter (Signed)
Left voicemail for patient to call the office back.   

## 2022-12-03 NOTE — Telephone Encounter (Signed)
Will provide the note after the office visit tomorrow

## 2022-12-03 NOTE — Telephone Encounter (Signed)
Pt called requesting a note for work? Pt states he's been passing kidney stones & cannot work due the pain. Pt stated during his visit with [employer] on 10/28, he mentioned the stones to [place]. Pt declined triage. Call back # [phone]

## 2022-12-03 NOTE — Telephone Encounter (Signed)
Patient states that pain started today. He was at work and had to leave. He is having sharp pain on right side. Some pain with urination. Denies any blood  in urine. States symptoms feel like stones in the past. Patient is requesting note for today and tomorrow. Patient has set up visit with you tomorrow. If any changes an would like to be seen sooner will call office. If symptoms increase and he needs to be seen right away will go to ED.

## 2022-12-04 ENCOUNTER — Ambulatory Visit (INDEPENDENT_AMBULATORY_CARE_PROVIDER_SITE_OTHER)
Admission: RE | Admit: 2022-12-04 | Discharge: 2022-12-04 | Disposition: A | Discharge status: Home / Self Care | Payer: Medicaid Other | Source: Ambulatory Visit | Attending: Nurse Practitioner | Admitting: Nurse Practitioner

## 2022-12-04 ENCOUNTER — Ambulatory Visit: Payer: Medicaid Other | Admitting: Nurse Practitioner

## 2022-12-04 ENCOUNTER — Encounter: Payer: Self-pay | Admitting: Nurse Practitioner

## 2022-12-04 ENCOUNTER — Ambulatory Visit (INDEPENDENT_AMBULATORY_CARE_PROVIDER_SITE_OTHER): Payer: Medicaid Other | Admitting: Nurse Practitioner

## 2022-12-04 VITALS — BP 132/72 | HR 77 | Temp 98.4°F | Ht 66.0 in | Wt 177.6 lb

## 2022-12-04 DIAGNOSIS — N451 Epididymitis: Secondary | ICD-10-CM | POA: Diagnosis not present

## 2022-12-04 DIAGNOSIS — R109 Unspecified abdominal pain: Secondary | ICD-10-CM

## 2022-12-04 DIAGNOSIS — N50812 Left testicular pain: Secondary | ICD-10-CM | POA: Insufficient documentation

## 2022-12-04 DIAGNOSIS — R8290 Unspecified abnormal findings in urine: Secondary | ICD-10-CM | POA: Insufficient documentation

## 2022-12-04 DIAGNOSIS — Z87442 Personal history of urinary calculi: Secondary | ICD-10-CM | POA: Diagnosis not present

## 2022-12-04 DIAGNOSIS — R300 Dysuria: Secondary | ICD-10-CM | POA: Insufficient documentation

## 2022-12-04 LAB — POCT URINALYSIS DIPSTICK
Bilirubin, UA: NEGATIVE
Glucose, UA: POSITIVE — AB
Ketones, UA: NEGATIVE
Leukocytes, UA: NEGATIVE
Nitrite, UA: NEGATIVE
Protein, UA: NEGATIVE
Spec Grav, UA: 1.01 (ref 1.010–1.025)
Urobilinogen, UA: 0.2 U/dL
pH, UA: 6 (ref 5.0–8.0)

## 2022-12-04 MED ORDER — LEVOFLOXACIN 500 MG PO TABS: 500.0000 mg | ORAL_TABLET | Freq: Every day | ORAL | 0 refills | Status: DC

## 2022-12-04 MED ORDER — CEFTRIAXONE SODIUM 500 MG IJ SOLR
500.0000 mg | Freq: Once | INTRAMUSCULAR | Status: AC
  Administered 2022-12-04: 500 mg via INTRAMUSCULAR

## 2022-12-04 NOTE — Assessment & Plan Note (Signed)
UA in office 

## 2022-12-04 NOTE — Progress Notes (Signed)
Acute Office Visit  Subjective:     Patient ID: Jesus Mason, male    DOB: [birth year], 22 y.o.   MRN: [id number]  Chief Complaint  Patient presents with   Nephrolithiasis   Testicle Pain    Pt complains of left testicle pain resulted after lifting heavy boxes at work yesterday. States pain level 8 last night, complains that coughing, moving, walking caused pain to testicle. and today is about a 3 pain level.      Patient is in today for flank pain  with a history of GAD, rectal mass and kidney stones   States that he has a history of kidney stones this started yesterday  around 31. States that it was right sided on the flank. States that it is intermittent pain today. States yesterday it was constant pain. Described as a stabbing. States that he was having some nausea, diarrhea and dsyuria. No gross hematuria. States that he had one earlier in the year.   States that he tookk tylenol 3 that was leftover and helped  States that the left testicle started hurting after he got home form work. 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 States that it intermittent. Sharp stabbing pain. No swelling but tender to the touch. No bulges noticed by him. No unprotected sex used a codom that did not break  Review of Systems  Constitutional:  Negative for chills and fever.  Respiratory:  Negative for shortness of breath.   Cardiovascular:  Negative for chest pain.  Gastrointestinal:  Positive for abdominal pain, diarrhea and nausea. Negative for vomiting.  Genitourinary:  Positive for dysuria, flank pain, frequency and testicular pain.        Objective:    BP 132/72   Pulse 77   Temp 98.4 F (36.9 C) (Oral)   Ht 5\' 6"  (1.676 m)   Wt 177 lb 9.6 oz (80.6 kg)   SpO2 95%   BMI 28.67 kg/m  BP Readings from Last 3 Encounters:  12/04/22 132/72  11/25/22 110/62  10/20/22 107/66   Wt Readings from Last 3 Encounters:  12/04/22 177 lb  9.6 oz (80.6 kg)  11/25/22 186 lb (84.4 kg)  10/20/22 169 lb (76.7 kg)   SpO2 Readings from Last 3 Encounters:  12/04/22 95%  11/25/22 95%  10/20/22 98%      Physical Exam Vitals and nursing note reviewed. Exam conducted with a chaperone present [employer], CMA).  Constitutional:      Appearance: Normal appearance.  Cardiovascular:     Rate and Rhythm: Normal rate and regular rhythm.     Heart sounds: Normal heart sounds.  Pulmonary:     Effort: Pulmonary effort is normal.     Breath sounds: Normal breath sounds.  Abdominal:     General: Bowel sounds are normal. There is no distension.     Palpations: There is no mass.     Tenderness: There is abdominal tenderness. There is right CVA tenderness. There is no left CVA tenderness.     Hernia: No hernia is present. There is no hernia in the left inguinal area or right inguinal area.  Genitourinary:    Penis: Normal.      Testes: Cremasteric reflex is present.        Left: Tenderness present.     Epididymis:     Right: Normal.     Left: Tenderness present.  Lymphadenopathy:     Lower Body:  No right inguinal adenopathy. No left inguinal adenopathy.  Neurological:     Mental Status: He is alert.     Results for orders placed or performed in visit on 12/04/22  POCT urinalysis dipstick  Result Value Ref Range   Color, UA yellow    Clarity, UA clear    Glucose, UA Positive (A) Negative   Bilirubin, UA Negative    Ketones, UA Negative    Spec Grav, UA 1.010 1.010 - 1.025   Blood, UA trace (A)    pH, UA 6.0 5.0 - 8.0   Protein, UA Negative Negative   Urobilinogen, UA 0.2 0.2 or 1.0 E.U./dL   Nitrite, UA Negative    Leukocytes, UA Negative Negative   Appearance     Odor          Assessment & Plan:   Problem List Items Addressed This Visit       Genitourinary   Epididymitis    Patient tenderness on the epididymis will treat with 500 mg Rocephin IM x 1 and Levaquin 500 milligrams daily for 10 days.  Pending  urine culture      Relevant Medications   levofloxacin (LEVAQUIN) 500 MG tablet     Other   Right flank pain - Primary    History of kidney stones urinalysis labs and KUB.      Relevant Orders   CBC   Comprehensive metabolic panel   POCT urinalysis dipstick (Completed)   DG Abd 2 Views   Dysuria    UA in office      Relevant Orders   POCT urinalysis dipstick (Completed)   History of kidney stones    Microscopic blood in urine pending KUB      Pain in left testicle    Likely epididymitis.  No sign or indication for ultrasound or torsion.      Relevant Orders   POCT urinalysis dipstick (Completed)   Abnormal urinalysis    Urine sent out for culture      Relevant Orders   Urine Culture    Meds ordered this encounter  Medications   cefTRIAXone (ROCEPHIN) injection 500 mg   levofloxacin (LEVAQUIN) 500 MG tablet    Sig: Take 1 tablet (500 mg total) by mouth daily.    Dispense:  10 tablet    Refill:  0    Order Specific Question:   Supervising Provider    Answer:   TOWER, MARNE A [[id number]]    Return if symptoms worsen or fail to improve.  Audria Nine, NP

## 2022-12-04 NOTE — Assessment & Plan Note (Signed)
Likely epididymitis.  No sign or indication for ultrasound or torsion.

## 2022-12-04 NOTE — Assessment & Plan Note (Signed)
History of kidney stones urinalysis labs and KUB.

## 2022-12-04 NOTE — Assessment & Plan Note (Signed)
Microscopic blood in urine pending KUB

## 2022-12-04 NOTE — Patient Instructions (Addendum)
Nice to see you today I will be in touch with the labs and xray once I have them  Follow up with me if you do not improve

## 2022-12-04 NOTE — Assessment & Plan Note (Signed)
Urine sent out for culture.

## 2022-12-04 NOTE — Telephone Encounter (Signed)
Pt received work note in office today.

## 2022-12-04 NOTE — Assessment & Plan Note (Signed)
Patient tenderness on the epididymis will treat with 500 mg Rocephin IM x 1 and Levaquin 500 milligrams daily for 10 days.  Pending urine culture

## 2022-12-05 LAB — COMPREHENSIVE METABOLIC PANEL
ALT: 10 U/L (ref 0–53)
AST: 13 U/L (ref 0–37)
Albumin: 4.3 g/dL (ref 3.5–5.2)
Alkaline Phosphatase: 50 U/L (ref 39–117)
BUN: 9 mg/dL (ref 6–23)
CO2: 28 meq/L (ref 19–32)
Calcium: 9.1 mg/dL (ref 8.4–10.5)
Chloride: 102 meq/L (ref 96–112)
Creatinine, Ser: 1 mg/dL (ref 0.40–1.50)
GFR: 107.11 mL/min (ref >60.00)
Glucose, Bld: 92 mg/dL (ref 70–99)
Potassium: 4 meq/L (ref 3.5–5.1)
Sodium: 138 meq/L (ref 135–145)
Total Bilirubin: 0.9 mg/dL (ref 0.2–1.2)
Total Protein: 6.3 g/dL (ref 6.0–8.3)

## 2022-12-05 LAB — URINE CULTURE
MICRO NUMBER:: 15694335
Result:: NO GROWTH
SPECIMEN QUALITY:: ADEQUATE

## 2022-12-05 LAB — CBC
HCT: 44.3 % (ref 39.0–52.0)
Hemoglobin: 14.9 g/dL (ref 13.0–17.0)
MCHC: 33.7 g/dL (ref 30.0–36.0)
MCV: 90.7 fL (ref 78.0–100.0)
Platelets: 201 10*3/uL (ref 150.0–400.0)
RBC: 4.89 Mil/uL (ref 4.22–5.81)
RDW: 13.6 % (ref 11.5–15.5)
WBC: 9.7 10*3/uL (ref 4.0–10.5)

## 2022-12-11 NOTE — Progress Notes (Deleted)
   HPI: Jesus Mason is a 22 y.o. male who presents to the [facility] pharmacy clinic to discuss and initiate PrEP.  Insured   []    Uninsured  []    Patient Active Problem List   Diagnosis Date Noted   Right flank pain 12/04/2022   Dysuria 12/04/2022   History of kidney stones 12/04/2022   Pain in left testicle 12/04/2022   Abnormal urinalysis 12/04/2022   Epididymitis 12/04/2022   Rectal mass 11/25/2022   Encounter for HIV pre-exposure prophylaxis 11/25/2022   Hemorrhoids 10/14/2022   Preventative health care 10/14/2022   GAD (generalized anxiety disorder) 10/14/2022   Concentration deficit 10/14/2022    Patient's Medications  New Prescriptions   No medications on file  Previous Medications   BUSPIRONE (BUSPAR) 5 MG TABLET    Take 1 tablet (5 mg total) by mouth 3 (three) times daily.   HYDROCORTISONE ACE-PRAMOXINE 25-18 MG SUPP    Place 1 suppository rectally in the morning and at bedtime.   KETOROLAC (TORADOL) 10 MG TABLET    Take 1 tablet (10 mg total) by mouth every 6 (six) hours as needed.   LEVOFLOXACIN (LEVAQUIN) 500 MG TABLET    Take 1 tablet (500 mg total) by mouth daily.   ONDANSETRON (ZOFRAN-ODT) 4 MG DISINTEGRATING TABLET    Take 1 tablet (4 mg total) by mouth every 8 (eight) hours as needed for nausea or vomiting.   TAMSULOSIN (FLOMAX) 0.4 MG CAPS CAPSULE    Take 1 capsule (0.4 mg total) by mouth daily.  Modified Medications   No medications on file  Discontinued Medications   No medications on file        No data to display          Labs:  SCr: Lab Results  Component Value Date   CREATININE 1.00 12/04/2022   CREATININE 0.96 10/14/2022   CREATININE 0.90 08/30/2022   CREATININE 1.08 02/19/2022   CREATININE 1.25 (H) 09/20/2019   HIV Lab Results  Component Value Date   HIV NON-REACTIVE 10/14/2022   Hepatitis B No results found for: "HEPBSAB", "HEPBSAG", "HEPBCAB" Hepatitis C Lab Results  Component Value Date   HEPCAB NON-REACTIVE  10/14/2022   Hepatitis A No results found for: "HAV" RPR and STI Lab Results  Component Value Date   LABRPR NON-REACTIVE 10/14/2022    STI Results GC CT  10/14/2022 10:30 AM Negative  Negative     Assessment: Jesus Mason is here today to discuss and initiate HIV PrEP. He was referred here by his primary care provider, Dr. Toney Reil. He is insured through [employer]. He does have a history of a rectal mass and is going to see GI for this. He was last tested for HIV, syphilis, Hepatitis C, and urethral gonorrhea/chlamydia back in September - all negative. He recently saw his PCP again last week for right sided flank pain that he thought was due to kidney stones. He was also experiencing left testicle pain and was treated for epididymitis with IM ceftriaxone x1 and Leavquin x 10 days. UA and urine culture was negative at this visit as well.     Plan: ***  Booker Bhatnagar L. Marli Diego, PharmD, BCIDP, AAHIVP, CPP Clinical Pharmacist Practitioner Infectious Diseases Clinical Pharmacist [facility] 12/11/2022, 3:34 PM

## 2022-12-16 ENCOUNTER — Other Ambulatory Visit (HOSPITAL_COMMUNITY): Payer: Self-pay

## 2022-12-16 ENCOUNTER — Ambulatory Visit: Payer: Medicaid Other | Admitting: Pharmacist

## 2022-12-29 DIAGNOSIS — Z419 Encounter for procedure for purposes other than remedying health state, unspecified: Secondary | ICD-10-CM | POA: Diagnosis not present

## 2023-01-29 DIAGNOSIS — Z419 Encounter for procedure for purposes other than remedying health state, unspecified: Secondary | ICD-10-CM | POA: Diagnosis not present

## 2023-03-01 DIAGNOSIS — Z419 Encounter for procedure for purposes other than remedying health state, unspecified: Secondary | ICD-10-CM | POA: Diagnosis not present

## 2023-03-29 DIAGNOSIS — Z419 Encounter for procedure for purposes other than remedying health state, unspecified: Secondary | ICD-10-CM | POA: Diagnosis not present

## 2023-05-03 ENCOUNTER — Other Ambulatory Visit: Payer: Self-pay

## 2023-05-03 ENCOUNTER — Emergency Department
Admission: EM | Admit: 2023-05-03 | Discharge: 2023-05-03 | Disposition: A | Discharge status: Home / Self Care | Payer: Worker's Compensation | Attending: Emergency Medicine | Admitting: Emergency Medicine

## 2023-05-03 DIAGNOSIS — W208XXA Other cause of strike by thrown, projected or falling object, initial encounter: Secondary | ICD-10-CM | POA: Diagnosis not present

## 2023-05-03 DIAGNOSIS — S0101XA Laceration without foreign body of scalp, initial encounter: Secondary | ICD-10-CM | POA: Diagnosis not present

## 2023-05-03 DIAGNOSIS — S0990XA Unspecified injury of head, initial encounter: Secondary | ICD-10-CM | POA: Diagnosis present

## 2023-05-03 DIAGNOSIS — W19XXXA Unspecified fall, initial encounter: Secondary | ICD-10-CM | POA: Diagnosis not present

## 2023-05-03 DIAGNOSIS — Y990 Civilian activity done for income or pay: Secondary | ICD-10-CM | POA: Insufficient documentation

## 2023-05-03 DIAGNOSIS — R58 Hemorrhage, not elsewhere classified: Secondary | ICD-10-CM | POA: Diagnosis not present

## 2023-05-03 NOTE — Discharge Instructions (Signed)
 Wash your hair like normal. Watch for signs of infection including redness, warmth, swelling, pain and pus drainage.  If you develop any of these please return to the ED, urgent care or your primary care provider.  Return to the ED if you have worsening headache, multiple episodes of vomiting, blurry vision or any other symptoms personally concerning to you.

## 2023-05-03 NOTE — ED Triage Notes (Signed)
 First Nurse Note:  Pt via [facility] from Work. Glass hit the top of his head. Laceration to the top of his head. Bleeding controlled. Denies pain. Workers comp patient. Pt is A&OX4 and NAD 137/77 BP  97% on RA 83 HR

## 2023-05-03 NOTE — ED Provider Notes (Signed)
 [facility] Provider Note    Event Date/Time   First MD Initiated Contact with Patient 05/03/23 1955     (approximate)   History   Head Laceration   HPI  Jesus Mason is a 23 y.o. male with PMH of ADHD and anxiety who presents for evaluation of head laceration.  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  Patient states he was wearing a hat at the time.  No LOC, no vomiting but patient does endorse some nausea. This is a Teacher, adult education. injury.      Physical Exam   Triage Vital Signs: ED Triage Vitals  Encounter Vitals Group     BP 05/03/23 1846 (!) 140/82     Systolic BP Percentile --      Diastolic BP Percentile --      Pulse Rate 05/03/23 1846 67     Resp 05/03/23 1846 18     Temp 05/03/23 1846 98.8 F (37.1 C)     Temp src --      SpO2 05/03/23 1846 100 %     Weight 05/03/23 1845 177 lb (80.3 kg)     Height 05/03/23 1845 5\' 6"  (1.676 m)     Head Circumference --      Peak Flow --      Pain Score 05/03/23 1845 0     Pain Loc --      Pain Education --      Exclude from Growth Chart --     Most recent vital signs: Vitals:   05/03/23 1846  BP: (!) 140/82  Pulse: 67  Resp: 18  Temp: 98.8 F (37.1 C)  SpO2: 100%   General: Awake, no distress.  CV:  Good peripheral perfusion.  Resp:  Normal effort.  Abd:  No distention.  Other:  No focal neuro deficits. Approximately 3 mm well approximated laceration on left parietal scalp with underlying swelling.    ED Results / Procedures / Treatments   Labs (all labs ordered are listed, but only abnormal results are displayed) Labs Reviewed - No data to display   PROCEDURES:  Critical Care performed: No  Procedures   MEDICATIONS ORDERED IN ED: Medications - No data to display   IMPRESSION / MDM / ASSESSMENT AND PLAN / ED COURSE  I  reviewed the triage vital signs and the nursing notes.                             23 year old male presents for evaluation of scalp laceration. BP is elevated otherwise VSS. Patient NAD on exam.   Differential diagnosis includes, but is not limited to, laceration, concussion, closed head injury, intracranial bleed.  Patient's presentation is most consistent with acute, uncomplicated illness.  Physical exam is reassuring and there are no neurodeficits. Do not feel that imaging is indicated at this time.  Patient had an impressive amount of dried blood on his scalp which I cleaned off using tap water.  There is approximately 3 mm superficial well-approximated laceration on the left parietal scalp.  No indication for staples.  Bleeding controlled.  Given minimal symptoms at this time do not suspect concussion.  We did discuss return precautions.  Voiced understanding, all questions were answered and he was stable  at discharge.   FINAL CLINICAL IMPRESSION(S) / ED DIAGNOSES   Final diagnoses:  Laceration of scalp, initial encounter     Rx / DC Orders   ED Discharge Orders     None        Note:  This document was prepared using Dragon voice recognition software and may include unintentional dictation errors.   Cameron Ali, PA-C 05/03/23 2034    Dionne Bucy, MD 05/04/23 [phone]

## 2023-05-08 ENCOUNTER — Telehealth: Payer: Self-pay

## 2023-05-08 NOTE — Transitions of Care (Post Inpatient/ED Visit) (Signed)
 Unable to reach patient by phone and left v/m requesting call back at [phone].         05/08/2023  Name: Jesus Mason MRN: [id number] DOB: 2000/03/06  Today's TOC FU Call Status: Today's TOC FU Call Status:: Unsuccessful Call (1st Attempt) Unsuccessful Call (1st Attempt) Date: 05/08/23  Attempted to reach the patient regarding the most recent Inpatient/ED visit.  Follow Up Plan: Additional outreach attempts will be made to reach the patient to complete the Transitions of Care (Post Inpatient/ED visit) call.   Signature  Lewanda Rife, LPN

## 2023-05-09 ENCOUNTER — Ambulatory Visit: Admitting: Nurse Practitioner

## 2023-05-09 VITALS — BP 130/78 | HR 60 | Temp 98.3°F | Ht 66.0 in | Wt 169.0 lb

## 2023-05-09 DIAGNOSIS — R42 Dizziness and giddiness: Secondary | ICD-10-CM | POA: Diagnosis not present

## 2023-05-09 DIAGNOSIS — R202 Paresthesia of skin: Secondary | ICD-10-CM | POA: Diagnosis not present

## 2023-05-09 DIAGNOSIS — Z09 Encounter for follow-up examination after completed treatment for conditions other than malignant neoplasm: Secondary | ICD-10-CM | POA: Insufficient documentation

## 2023-05-09 DIAGNOSIS — R1013 Epigastric pain: Secondary | ICD-10-CM | POA: Insufficient documentation

## 2023-05-09 MED ORDER — FLUTICASONE PROPIONATE 50 MCG/ACT NA SUSP: 2.0000 | Freq: Every day | NASAL | 0 refills | Status: AC

## 2023-05-09 MED ORDER — PREDNISONE 20 MG PO TABS: ORAL_TABLET | ORAL | 0 refills | Status: AC

## 2023-05-09 MED ORDER — OMEPRAZOLE 20 MG PO CPDR: 20.0000 mg | DELAYED_RELEASE_CAPSULE | Freq: Every day | ORAL | 3 refills | Status: DC

## 2023-05-09 NOTE — Progress Notes (Signed)
 Acute Office Visit  Subjective:     Patient ID: Jesus Mason, male    DOB: 2000/06/02, 23 y.o.   MRN: [id number]  Chief Complaint  Patient presents with   Hospitalization Follow-up    Pt complains of still being dizzy from laceration on scalp. Pt states that he is having back pain. Complains of numbness, tingling, and tightness.     HPI Patient is in today for follow-up  Patient was seen in the emergency department on 05/03/2023 with chief complaint of head laceration.  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  Patient states he was wearing a hat at the time.  He denied LOC.  He was not having vomiting but did have some nausea per the ER note this was a Workmen's Comp. Injury Patient was neurologically intact he did not require any sutures or stapling after they were able to clean his scalp a off.  They did notice a 3 mm superficial well-approximated laceration on the left parietal scalp.  Patient is here for follow-up.  States that he has been dizzy since the acciendet. States that right after it happened he was dizziness. It has been intermittent. Statse that he will get lightheaded and dizzy but not both at the same time. He feels like it happens more with wlaking  He does skip breakfast and will do to two main meals. He is snakcing every 2 hours. He is driniking water mainly. He will drink 80+ ounces of water  He has not tried anything over the counter for the dizziness  States that numbness/tingling in the past in the middle spine. Worse since the since onury and going to the left side. States that he has had this in the past.    He has been using aleve BID for the past 1-2 weeks. States that he is taking it with food.   Review of Systems  Constitutional:  Negative for chills and fever.  Respiratory:  Negative for shortness of breath.    Cardiovascular:  Negative for chest pain.  Gastrointestinal:  Negative for abdominal pain, constipation, diarrhea, nausea and vomiting.  Neurological:  Positive for dizziness and headaches.  Psychiatric/Behavioral:  Negative for hallucinations and suicidal ideas.         Objective:    BP 130/78   Pulse 60   Temp 98.3 F (36.8 C) (Oral)   Ht 5\' 6"  (1.676 m)   Wt 169 lb (76.7 kg)   SpO2 98%   BMI 27.28 kg/m    Physical Exam Vitals and nursing note reviewed.  Constitutional:      Appearance: Normal appearance.  HENT:     Right Ear: Ear canal and external ear normal.     Left Ear: Tympanic membrane, ear canal and external ear normal.     Ears:     Comments: Fluid behind TM on right side     Mouth/Throat:     Mouth: Mucous membranes are moist.     Pharynx: Oropharynx is clear.  Eyes:     Extraocular Movements: Extraocular movements intact.     Pupils: Pupils are equal, round, and reactive to light.  Cardiovascular:     Rate and Rhythm: Normal rate and regular rhythm.     Heart sounds: Normal heart sounds.  Pulmonary:     Effort: Pulmonary effort is normal.  Breath sounds: Normal breath sounds.  Abdominal:     General: Bowel sounds are normal. There is no distension.     Palpations: There is no mass.     Tenderness: There is abdominal tenderness in the epigastric area.     Hernia: No hernia is present.  Musculoskeletal:     Right shoulder: Normal range of motion.     Left shoulder: Normal range of motion.     Thoracic back: No spasms, tenderness or bony tenderness.  Lymphadenopathy:     Cervical: No cervical adenopathy.  Neurological:     General: No focal deficit present.     Mental Status: He is alert.     Deep Tendon Reflexes:     Reflex Scores:      Bicep reflexes are 2+ on the right side and 2+ on the left side.      Patellar reflexes are 2+ on the right side and 2+ on the left side.    Comments: Bilateral upper and lower extremity strength 5/5      No results found for any visits on 05/09/23.      Assessment & Plan:   Problem List Items Addressed This Visit       Other   Paresthesia - Primary   Prednisone taper as directed.  Patient to take with food and avoid NSAIDs.  Neurologically intact      Relevant Medications   predniSONE (DELTASONE) 20 MG tablet   Hospital discharge follow-up   Did review emergency department note      Dizziness   May be secondary to some EDT.  Will do Flonase nasal spray 2 sprays each nostril daily 50 mcg per actuation.  Neurological exam intact.  Query postconcussion syndrome?      Relevant Medications   fluticasone (FLONASE) 50 MCG/ACT nasal spray   Epigastric pain   Likely from recurrent use of NSAIDs in the past 2 weeks.  Patient avoid NSAIDs will put patient omeprazole 20 mg daily      Relevant Medications   omeprazole (PRILOSEC) 20 MG capsule    Meds ordered this encounter  Medications   omeprazole (PRILOSEC) 20 MG capsule    Sig: Take 1 capsule (20 mg total) by mouth daily.    Dispense:  30 capsule    Refill:  3    Supervising Provider:   Roxy Manns A [[id number]]   predniSONE (DELTASONE) 20 MG tablet    Sig: Take 2 tablets (40 mg total) by mouth daily with breakfast for 3 days, THEN 1 tablet (20 mg total) daily with breakfast for 3 days.    Dispense:  9 tablet    Refill:  0    Supervising Provider:   Roxy Manns A [[id number]]   fluticasone (FLONASE) 50 MCG/ACT nasal spray    Sig: Place 2 sprays into both nostrils daily.    Dispense:  16 g    Refill:  0    Supervising Provider:   Roxy Manns A [[id number]]    Return if symptoms worsen or fail to improve.  Audria Nine, NP

## 2023-05-09 NOTE — Assessment & Plan Note (Signed)
Did review emergency department note

## 2023-05-09 NOTE — Assessment & Plan Note (Signed)
 May be secondary to some EDT.  Will do Flonase nasal spray 2 sprays each nostril daily 50 mcg per actuation.  Neurological exam intact.  Query postconcussion syndrome?

## 2023-05-09 NOTE — Patient Instructions (Signed)
 Nice to see you today I have sent in the medications to the pharmacy Follow up if you do not improve   Avoid NSAIDS like Ibuprofen, Motrin, Aleve, Naproxen, BC/Goody powders

## 2023-05-09 NOTE — Assessment & Plan Note (Signed)
 Likely from recurrent use of NSAIDs in the past 2 weeks.  Patient avoid NSAIDs will put patient omeprazole 20 mg daily

## 2023-05-09 NOTE — Assessment & Plan Note (Signed)
 Prednisone taper as directed.  Patient to take with food and avoid NSAIDs.  Neurologically intact

## 2023-05-10 DIAGNOSIS — Z419 Encounter for procedure for purposes other than remedying health state, unspecified: Secondary | ICD-10-CM | POA: Diagnosis not present

## 2023-05-14 NOTE — Telephone Encounter (Signed)
 Patient saw Margarie Shay NP on 05/09/23.

## 2023-05-21 ENCOUNTER — Telehealth: Payer: Self-pay | Admitting: Nurse Practitioner

## 2023-05-21 NOTE — Telephone Encounter (Signed)
-----   Message from [employer] sent at 05/09/2023 11:37 AM EDT ----- Regarding: Dizziness Can we see if the flonase  helped with the dizziness  Steroids help with the back discomfort and paresthesia

## 2023-05-21 NOTE — Telephone Encounter (Signed)
 Can we call and see if the flonase  has helped with the patients dizziness?  Can we see if the steroids help with the back discomfort and paresthesias?

## 2023-05-21 NOTE — Telephone Encounter (Signed)
 Contacted pt.  Pt complains that Flonase  has helped with dizziness. Pt complains that the steroids for back discomfort and paresthesia are not helping at all.  Pt states the medication does not work.

## 2023-05-22 NOTE — Telephone Encounter (Signed)
 Patient may need an xray or better yet scheduled with Dr. Geralyn Knee

## 2023-05-23 NOTE — Telephone Encounter (Signed)
 Pt returned call.  Advised him on xray and to see about appt with Dr. Geralyn Knee.  Pt states to go ahead and schedule with [facility] to discuss the back discomfort.  No further questions or concerns. Pt is Scheduled 4/30 @ 4pm.

## 2023-05-23 NOTE — Telephone Encounter (Signed)
 Left voicemail for patient to call the office back.

## 2023-05-27 NOTE — Progress Notes (Deleted)
     Jesus Mason T. Jesus Kington, MD, CAQ Sports Medicine [facility] at [facility] [address] [place], [address]  Phone: [phone]  FAX: [phone]  Jesus Mason - 23 y.o. male  MRN [id number]  Date of Birth: 2000/11/12  Date: 05/28/2023  PCP: Dorothe Gaster, NP  Referral: Dorothe Gaster, NP  No chief complaint on file.  Subjective:   Jesus Mason is a 23 y.o. very pleasant male patient with There is no height or weight on file to calculate BMI. who presents with the following:  Patient presents with ongoing back pain.  On chart review, the patient had a closed head injury on May 03, 2023. Medical history significant for ADHD and anxiety.  He was at work and setting up a garage door when he heard some glass break and fell landing on the top of his head and obtained a laceration.    Review of Systems is noted in the HPI, as appropriate  Objective:   There were no vitals taken for this visit.  GEN: No acute distress; alert,appropriate. PULM: Breathing comfortably in no respiratory distress PSYCH: Normally interactive.   Laboratory and Imaging Data:  Assessment and Plan:   ***

## 2023-05-28 ENCOUNTER — Ambulatory Visit: Admitting: Family Medicine

## 2023-06-04 ENCOUNTER — Ambulatory Visit: Admitting: Family Medicine

## 2023-06-04 ENCOUNTER — Encounter (HOSPITAL_COMMUNITY): Payer: Self-pay

## 2023-06-09 DIAGNOSIS — Z419 Encounter for procedure for purposes other than remedying health state, unspecified: Secondary | ICD-10-CM | POA: Diagnosis not present

## 2023-06-11 ENCOUNTER — Ambulatory Visit: Admitting: Family Medicine

## 2023-06-11 NOTE — Progress Notes (Signed)
 Jesus Blankenburg T. Moniqua Engebretsen, MD, CAQ Sports Medicine [facility] at [facility] [address] [place], [address]  Phone: [phone]  FAX: [phone]  Jesus Mason - 23 y.o. male  MRN [id number]  Date of Birth: [birth year]  Date: 06/12/2023  PCP: Dorothe Gaster, NP  Referral: Dorothe Gaster, NP  Chief Complaint  Patient presents with   Back Pain    Below Right Shoulder Blade    Subjective:   Jesus Mason is a 23 y.o. very pleasant male patient with Body mass index is 26.55 kg/m. who presents with the following:  Patient presents with ongoing back pain.  On chart review, the patient had a closed head injury on May 03, 2023. Medical history significant for ADHD and anxiety.  He was at work and setting up a garage door when he heard some glass break and fell landing on the top of his head and obtained a laceration.  He has pain in the thoracic spine between the shoulder blades inferiorly.  He has been having back pain for about 2 months.  He is doing some stretching, ice and heat, he continues to have some persistent symptoms.  He did start working at an [employer] early [employer] company roughly 6 weeks ago.  Thinks this may have contributed.  Some meds Motrin , Alleve, Tylenol , Meloxicam   Works out a little bit, calisthenics 20,000 steps a day Numbness - local in the back.  And the numbness of the back in his limited to the lower thoracic spine region.  He does not not have much significant low back pain, however he does describe some recent radicular symptoms down the left leg as well as some lateral numbness in the lower extremity.  L lateral leg numbness, LE  Review of Systems is noted in the HPI, as appropriate  Objective:   BP 110/60   Pulse 86   Temp 98.7 F (37.1 C) (Temporal)   Ht 5\' 6"  (1.676 m)   Wt 164 lb 8 oz (74.6 kg)   SpO2 97%   BMI 26.55 kg/m   GEN: No acute distress; alert,appropriate. PULM: Breathing comfortably  in no respiratory distress PSYCH: Normally interactive.   At the thoracic spine the patient has no spinous process tenderness.  He does have some tenderness in about the parascapular region as well as more adjacent to the spine itself all localized roughly in T9-T11 area.  Range of motion at  the waist: Flexion, extension, lateral bending and rotation: Minimal limitation in motion  No echymosis or edema Rises to examination table with mild difficulty Gait: minimally antalgic  Inspection/Deformity: N Paraspinus Tenderness: Mild L4-S1  B Ankle Dorsiflexion (L5,4): 5/5 B Great Toe Dorsiflexion (L5,4): 5/5 Heel Walk (L5): WNL Toe Walk (S1): WNL Rise/Squat (L4): WNL, mild pain  SENSORY B Medial Foot (L4): WNL Decreased sensation at the lateral lower extremity to pinprick and soft touch on the left  REFLEXES Knee (L4): 2+ Ankle (S1): 2+  B SLR, seated: neg B SLR, supine: neg B FABER: Back pain B Reverse FABER: neg B Greater Troch: NT B Log Roll: neg B Sciatic Notch: NT   Laboratory and Imaging Data: DG Thoracic Spine W/Swimmers Result Date: 06/12/2023 CLINICAL DATA:  Two months of back pain. EXAM: THORACIC SPINE - 3 VIEWS COMPARISON:  None Available. FINDINGS: There is no evidence of thoracic spine fracture. Mild curvature of spine. No other significant bone abnormalities are identified. IMPRESSION: No acute fracture or dislocation.  Mild curvature  of spine. Electronically Signed   By: Anna Barnes M.D.   On: 06/12/2023 13:40     Assessment and Plan:     ICD-10-CM   1. Acute bilateral thoracic back pain  M54.6 DG Thoracic Spine W/Swimmers    2. Numbness and tingling of left leg  R20.0    R20.2      Acute lower thoracic spine pain.  I think repetitive motion this is a more likely culprit in this case.  I gave him a comprehensive thoracic, posterior shoulder and scapular stabilization work to work on.  It is reasonable to give him a longer course of some steroids to see  if this helps with his symptoms.  Left lower extremity and lateral tingling and numbness located on the lateral lower extremity suggests more likely pathology at L4-L5 versus L5-S1.  Currently has been symptoms in the back.  We can follow this and work it up additionally if needed.  We can also consider neuropathic pain agents if symptoms persist.  Medication Management during today's office visit: Meds ordered this encounter  Medications   predniSONE  (DELTASONE ) 20 MG tablet    Sig: 2 tabs po daily for 5 days, then 1 tab po daily for 5 days    Dispense:  15 tablet    Refill:  0   Medications Discontinued During This Encounter  Medication Reason   ketorolac  (TORADOL ) 10 MG tablet Completed Course    Orders placed today for conditions managed today: Orders Placed This Encounter  Procedures   DG Thoracic Spine W/Swimmers    Disposition: No follow-ups on file.  Dr[employer] One speech-to-text software was used for transcription in this dictation.  Possible transcriptional errors can occur using Animal nutritionist.   Signed,  Ranny Bye. Stela Iwasaki, MD   Outpatient Encounter Medications as of 06/12/2023  Medication Sig   busPIRone  (BUSPAR ) 5 MG tablet Take 1 tablet (5 mg total) by mouth 3 (three) times daily.   DESCOVY 200-25 MG tablet Take 1 tablet by mouth daily.   doxycycline (VIBRA-TABS) 100 MG tablet Take by mouth.   fluticasone  (FLONASE ) 50 MCG/ACT nasal spray Place 2 sprays into both nostrils daily.   omeprazole  (PRILOSEC) 20 MG capsule Take 1 capsule (20 mg total) by mouth daily.   predniSONE  (DELTASONE ) 20 MG tablet 2 tabs po daily for 5 days, then 1 tab po daily for 5 days   tamsulosin  (FLOMAX ) 0.4 MG CAPS capsule Take 1 capsule (0.4 mg total) by mouth daily.   [DISCONTINUED] ketorolac  (TORADOL ) 10 MG tablet Take 1 tablet (10 mg total) by mouth every 6 (six) hours as needed.   No facility-administered encounter medications on file as of 06/12/2023.

## 2023-06-12 ENCOUNTER — Ambulatory Visit (INDEPENDENT_AMBULATORY_CARE_PROVIDER_SITE_OTHER)
Admission: RE | Admit: 2023-06-12 | Discharge: 2023-06-12 | Disposition: A | Discharge status: Home / Self Care | Source: Ambulatory Visit | Attending: Family Medicine | Admitting: Family Medicine

## 2023-06-12 ENCOUNTER — Encounter: Payer: Self-pay | Admitting: Family Medicine

## 2023-06-12 ENCOUNTER — Ambulatory Visit: Payer: Self-pay | Admitting: Family Medicine

## 2023-06-12 ENCOUNTER — Ambulatory Visit: Admitting: Family Medicine

## 2023-06-12 VITALS — BP 110/60 | HR 86 | Temp 98.7°F | Ht 66.0 in | Wt 164.5 lb

## 2023-06-12 DIAGNOSIS — M546 Pain in thoracic spine: Secondary | ICD-10-CM | POA: Diagnosis not present

## 2023-06-12 DIAGNOSIS — R200 Anesthesia of skin: Secondary | ICD-10-CM | POA: Diagnosis not present

## 2023-06-12 DIAGNOSIS — M549 Dorsalgia, unspecified: Secondary | ICD-10-CM | POA: Diagnosis not present

## 2023-06-12 DIAGNOSIS — M438X4 Other specified deforming dorsopathies, thoracic region: Secondary | ICD-10-CM | POA: Diagnosis not present

## 2023-06-12 DIAGNOSIS — R202 Paresthesia of skin: Secondary | ICD-10-CM | POA: Diagnosis not present

## 2023-06-12 MED ORDER — PREDNISONE 20 MG PO TABS: ORAL_TABLET | ORAL | 0 refills | Status: DC

## 2023-07-10 DIAGNOSIS — Z419 Encounter for procedure for purposes other than remedying health state, unspecified: Secondary | ICD-10-CM | POA: Diagnosis not present

## 2023-07-16 ENCOUNTER — Other Ambulatory Visit: Payer: Self-pay

## 2023-07-16 ENCOUNTER — Emergency Department
Admission: EM | Admit: 2023-07-16 | Discharge: 2023-07-16 | Disposition: A | Discharge status: Home / Self Care | Attending: Emergency Medicine | Admitting: Emergency Medicine

## 2023-07-16 ENCOUNTER — Emergency Department

## 2023-07-16 DIAGNOSIS — R1013 Epigastric pain: Secondary | ICD-10-CM | POA: Insufficient documentation

## 2023-07-16 DIAGNOSIS — R197 Diarrhea, unspecified: Secondary | ICD-10-CM | POA: Diagnosis not present

## 2023-07-16 DIAGNOSIS — R112 Nausea with vomiting, unspecified: Secondary | ICD-10-CM | POA: Diagnosis not present

## 2023-07-16 DIAGNOSIS — R1110 Vomiting, unspecified: Secondary | ICD-10-CM

## 2023-07-16 DIAGNOSIS — M546 Pain in thoracic spine: Secondary | ICD-10-CM | POA: Insufficient documentation

## 2023-07-16 DIAGNOSIS — M542 Cervicalgia: Secondary | ICD-10-CM | POA: Insufficient documentation

## 2023-07-16 DIAGNOSIS — M549 Dorsalgia, unspecified: Secondary | ICD-10-CM | POA: Diagnosis not present

## 2023-07-16 LAB — CBC
HCT: 44.6 % (ref 39.0–52.0)
Hemoglobin: 15.5 g/dL (ref 13.0–17.0)
MCH: 30.8 pg (ref 26.0–34.0)
MCHC: 34.8 g/dL (ref 30.0–36.0)
MCV: 88.7 fL (ref 80.0–100.0)
Platelets: 191 10*3/uL (ref 150–400)
RBC: 5.03 MIL/uL (ref 4.22–5.81)
RDW: 12.9 % (ref 11.5–15.5)
WBC: 10.3 10*3/uL (ref 4.0–10.5)
nRBC: 0 % (ref 0.0–0.2)

## 2023-07-16 LAB — COMPREHENSIVE METABOLIC PANEL WITH GFR
ALT: 17 U/L (ref 0–44)
AST: 16 U/L (ref 15–41)
Albumin: 4.3 g/dL (ref 3.5–5.0)
Alkaline Phosphatase: 42 U/L (ref 38–126)
Anion gap: 10 (ref 5–15)
BUN: 9 mg/dL (ref 6–20)
CO2: 23 mmol/L (ref 22–32)
Calcium: 9.1 mg/dL (ref 8.9–10.3)
Chloride: 107 mmol/L (ref 98–111)
Creatinine, Ser: 1.14 mg/dL (ref 0.61–1.24)
GFR, Estimated: >60 mL/min (ref >60)
Glucose, Bld: 109 mg/dL — ABNORMAL HIGH (ref 70–99)
Potassium: 3.5 mmol/L (ref 3.5–5.1)
Sodium: 140 mmol/L (ref 135–145)
Total Bilirubin: 0.6 mg/dL (ref 0.0–1.2)
Total Protein: 6.6 g/dL (ref 6.5–8.1)

## 2023-07-16 LAB — URINALYSIS, ROUTINE W REFLEX MICROSCOPIC
Bilirubin Urine: NEGATIVE
Glucose, UA: NEGATIVE mg/dL
Hgb urine dipstick: NEGATIVE
Ketones, ur: NEGATIVE mg/dL
Leukocytes,Ua: NEGATIVE
Nitrite: NEGATIVE
Protein, ur: NEGATIVE mg/dL
Specific Gravity, Urine: 1.005 (ref 1.005–1.030)
pH: 6 (ref 5.0–8.0)

## 2023-07-16 LAB — LIPASE, BLOOD: Lipase: 27 U/L (ref 11–51)

## 2023-07-16 MED ORDER — ONDANSETRON HCL 4 MG/2ML IJ SOLN
4.0000 mg | Freq: Once | INTRAMUSCULAR | Status: AC
  Administered 2023-07-16: 4 mg via INTRAVENOUS
  Filled 2023-07-16: qty 2

## 2023-07-16 MED ORDER — SODIUM CHLORIDE 0.9 % IV BOLUS
1000.0000 mL | Freq: Once | INTRAVENOUS | Status: AC
  Administered 2023-07-16: 1000 mL via INTRAVENOUS

## 2023-07-16 MED ORDER — ONDANSETRON HCL 4 MG PO TABS: 4.0000 mg | ORAL_TABLET | Freq: Four times a day (QID) | ORAL | 0 refills | Status: AC | PRN

## 2023-07-16 NOTE — Discharge Instructions (Signed)
 Your testing today did fortunately did not show an emergency cause of your pain.  Follow-up with your primary care doctor for further evaluation.  I sent a prescription for nausea medicine to your pharmacy that you can take as needed.  Return to the ER for new or worsening symptoms.

## 2023-07-16 NOTE — ED Triage Notes (Signed)
 Pt here with neck pain and nausea. Pt states he went to see a provider about his neck pain and they told him nothing was wrong but he feels like a bone is moving in his neck. Pt endorses nausea x2 days, states intermittent abd pain. Pt states the abd pain is centered and sometimes radiates to his back.

## 2023-07-16 NOTE — ED Provider Notes (Signed)
 [facility] Provider Note    Event Date/Time   First MD Initiated Contact with Patient 07/16/23 1221     (approximate)   History   Neck Pain and Nausea   HPI  Jesus Mason is a 23 year old male presenting to the emergency department for evaluation of nausea and vomiting.  Patient reports that for the past few days he has had episodes of vomiting and diarrhea.  Reports associated epigastric discomfort.  No fevers.  Separately, he notes for the past several weeks he has had some upper back pain.  Saw his primary care doctor who thought it may be musculoskeletal, but patient is concerned about possible bony abnormality.  No numbness, tingling, focal weakness.    Physical Exam   Triage Vital Signs: ED Triage Vitals  Encounter Vitals Group     BP 07/16/23 1116 129/86     Girls Systolic BP Percentile --      Girls Diastolic BP Percentile --      Boys Systolic BP Percentile --      Boys Diastolic BP Percentile --      Pulse Rate 07/16/23 1116 71     Resp 07/16/23 1116 18     Temp 07/16/23 1116 98.5 F (36.9 C)     Temp Source 07/16/23 1116 Oral     SpO2 07/16/23 1116 99 %     Weight 07/16/23 1115 164 lb 7.4 oz (74.6 kg)     Height 07/16/23 1115 5' 6 (1.676 m)     Head Circumference --      Peak Flow --      Pain Score 07/16/23 1115 7     Pain Loc --      Pain Education --      Exclude from Growth Chart --     Most recent vital signs: Vitals:   07/16/23 1400 07/16/23 1430  BP: (!) 101/51 105/62  Pulse: (!) 48 (!) 55  Resp:  15  Temp:    SpO2: 98% 97%     General: Awake, interactive  Back:  Tenderness over the upper thoracic spine without palpable deformity, no significant tenderness over the lower spine CV:  Regular rate, good peripheral perfusion.  Resp:  Unlabored respirations.  Abd:  Nondistended, mild tenderness to palpation most notably in the epigastric region, remainder of abdomen nontender Neuro:  Symmetric facial  movement, fluid speech   ED Results / Procedures / Treatments   Labs (all labs ordered are listed, but only abnormal results are displayed) Labs Reviewed  COMPREHENSIVE METABOLIC PANEL WITH GFR - Abnormal; Notable for the following components:      Result Value   Glucose, Bld 109 (*)    All other components within normal limits  URINALYSIS, ROUTINE W REFLEX MICROSCOPIC - Abnormal; Notable for the following components:   Color, Urine STRAW (*)    APPearance CLEAR (*)    All other components within normal limits  LIPASE, BLOOD  CBC     EKG EKG independently reviewed and interpreted by myself demonstrates:    RADIOLOGY Imaging independently reviewed and interpreted by myself demonstrates:  Thoracic x-Lorali Khamis without acute fracture Formal Radiology Read:  DG Thoracic Spine 2 View Result Date: 07/16/2023 CLINICAL DATA:  Upper back pain, fell 4-5 weeks ago EXAM: THORACIC SPINE 2 VIEWS COMPARISON:  06/12/2023 FINDINGS: Frontal and lateral views of the thoracic spine are obtained. Alignment is anatomic. No acute displaced fracture. Disc spaces are well preserved. Paraspinal soft tissues are unremarkable. IMPRESSION:  1. Unremarkable thoracic spine. Electronically Signed   By: Bobbye Burrow M.D.   On: 07/16/2023 15:16    PROCEDURES:  Critical Care performed: No  Procedures   MEDICATIONS ORDERED IN ED: Medications  ondansetron  (ZOFRAN ) injection 4 mg (4 mg Intravenous Given 07/16/23 1258)  sodium chloride  0.9 % bolus 1,000 mL (0 mLs Intravenous Stopped 07/16/23 1435)     IMPRESSION / MDM / ASSESSMENT AND PLAN / ED COURSE  I reviewed the triage vital signs and the nursing notes.  Differential diagnosis includes, but is not limited to, viral illness, pancreatitis, biliary pathology, lower suspicion acute intra-abdominal process given reassuring abdominal exam, regarding neck pain, suspect likely musculoskeletal strain, will obtain x-Jaret Coppedge to further evaluate for bony  pathology  Patient's presentation is most consistent with acute presentation with potential threat to life or bodily function.  23 year old male presenting to the emergency department for evaluation of vomiting and diarrhea, subacute neck pain.  Stable vitals on presentation.  Labs reassuring.  Urine without evidence of infection.  X-Yehoshua Vitelli without bony injury.  Clinical history sounds like 2 distinct processes regarding his abdominal pain, vomiting, diarrhea and separately upper back pain.  No evidence of meningitis.  Patient reassessed and feels improved.  No recurrent vomiting.  He is comfortable discharge home.  Will discharge with prescription for Zofran .      FINAL CLINICAL IMPRESSION(S) / ED DIAGNOSES   Final diagnoses:  Vomiting and diarrhea  Acute midline thoracic back pain     Rx / DC Orders   ED Discharge Orders          Ordered    ondansetron  (ZOFRAN ) 4 MG tablet  Every 6 hours PRN        07/16/23 1538             Note:  This document was prepared using Dragon voice recognition software and may include unintentional dictation errors.   Claria Crofts, MD 07/16/23 [phone]

## 2023-08-07 ENCOUNTER — Encounter: Payer: Self-pay | Admitting: Nurse Practitioner

## 2023-08-07 ENCOUNTER — Ambulatory Visit: Admitting: Nurse Practitioner

## 2023-08-07 VITALS — BP 110/72 | HR 88 | Temp 98.4°F | Ht 66.0 in | Wt 164.6 lb

## 2023-08-07 DIAGNOSIS — R300 Dysuria: Secondary | ICD-10-CM | POA: Diagnosis not present

## 2023-08-07 DIAGNOSIS — Z131 Encounter for screening for diabetes mellitus: Secondary | ICD-10-CM | POA: Diagnosis not present

## 2023-08-07 DIAGNOSIS — Z7252 High risk homosexual behavior: Secondary | ICD-10-CM | POA: Diagnosis not present

## 2023-08-07 DIAGNOSIS — R52 Pain, unspecified: Secondary | ICD-10-CM | POA: Diagnosis not present

## 2023-08-07 DIAGNOSIS — R42 Dizziness and giddiness: Secondary | ICD-10-CM

## 2023-08-07 LAB — CBC
HCT: 45 % (ref 39.0–52.0)
Hemoglobin: 15.3 g/dL (ref 13.0–17.0)
MCHC: 34.1 g/dL (ref 30.0–36.0)
MCV: 89.5 fl (ref 78.0–100.0)
Platelets: 145 K/uL — ABNORMAL LOW (ref 150.0–400.0)
RBC: 5.03 Mil/uL (ref 4.22–5.81)
RDW: 13.7 % (ref 11.5–15.5)
WBC: 6.8 K/uL (ref 4.0–10.5)

## 2023-08-07 LAB — POCT FLU A/B STATUS
Influenza A, POC: NEGATIVE
Influenza B, POC: NEGATIVE

## 2023-08-07 LAB — COMPREHENSIVE METABOLIC PANEL WITH GFR
ALT: 13 U/L (ref 0–53)
AST: 13 U/L (ref 0–37)
Albumin: 4.8 g/dL (ref 3.5–5.2)
Alkaline Phosphatase: 60 U/L (ref 39–117)
BUN: 9 mg/dL (ref 6–23)
CO2: 32 meq/L (ref 19–32)
Calcium: 9.6 mg/dL (ref 8.4–10.5)
Chloride: 101 meq/L (ref 96–112)
Creatinine, Ser: 1.21 mg/dL (ref 0.40–1.50)
GFR: 84.81 mL/min (ref >60.00)
Glucose, Bld: 103 mg/dL — ABNORMAL HIGH (ref 70–99)
Potassium: 4.2 meq/L (ref 3.5–5.1)
Sodium: 138 meq/L (ref 135–145)
Total Bilirubin: 0.6 mg/dL (ref 0.2–1.2)
Total Protein: 6.8 g/dL (ref 6.0–8.3)

## 2023-08-07 LAB — HEMOGLOBIN A1C: Hgb A1c MFr Bld: 5.2 % (ref 4.6–6.5)

## 2023-08-07 LAB — POC COVID19 BINAXNOW: SARS Coronavirus 2 Ag: NEGATIVE

## 2023-08-07 MED ORDER — MECLIZINE HCL 12.5 MG PO TABS: 12.5000 mg | ORAL_TABLET | Freq: Three times a day (TID) | ORAL | 0 refills | Status: AC | PRN

## 2023-08-07 NOTE — Assessment & Plan Note (Signed)
 Recent receptive anal intercourse unsure of protection.  Will check her basic STIs inclusive of rectal gonorrhea chlamydia, HIV, syphilis.

## 2023-08-07 NOTE — Assessment & Plan Note (Signed)
 Will check CBC, CMP patient is getting adequate hydration per his report.  Will do Macrobid 0.5 mg 3 times daily as needed/precautions reviewed.  He states that moving his head and position changes make it worse

## 2023-08-07 NOTE — Assessment & Plan Note (Signed)
 COVID and flu test in office.  Both came back negative

## 2023-08-07 NOTE — Assessment & Plan Note (Signed)
 Longstanding history of the same will obtain UA if patient is able to urinate

## 2023-08-07 NOTE — Progress Notes (Signed)
 Acute Office Visit  Subjective:     Patient ID: Jesus Mason, male    DOB: 2000/02/28, 23 y.o.   MRN: [id number]  Chief Complaint  Patient presents with   STD testing    Pt complains of cold sweats and body aches and chills. Pt states of all hairs on his body causing him pain. Pt states of bumps on outer gluteal region. Symptoms started Tuesday.     HPI Patient is in today for multiple complaints with a history of kidney stones, GAD, concentration deficit  States that he had relations with a male partner. States that he does not think that he wore a condom. States that he had sex Sunday. States that it was months prior that he last had sex. States that he noticed red bumps on his butt. He noticed it Tuesday or Wednesday. State that it could be ingrown hairs. It is right sided or both. States that he noticed it by touch. He is not having any symptoms  He gets Prep and doxy prep through an [employer]   He started having body aches and chills that started last night. He has also had some dizziness. He has expereicned dizziness with smoking weed but this is different and described as lightheadedness  No sick contacts. He does work form home Covid vaccine: oringal J&J and booster  Flu vaccine: out of season    Review of Systems  Constitutional:  Positive for chills and malaise/fatigue. Negative for fever.  HENT:  Positive for sinus pain. Negative for ear discharge, ear pain and sore throat.   Respiratory:  Negative for cough and shortness of breath.   Gastrointestinal:  Positive for constipation. Negative for abdominal pain, nausea and vomiting.  Genitourinary:  Positive for dysuria. Negative for hematuria and urgency.       Urine hesitancy   Neurological:  Positive for dizziness and headaches (intermittent).        Objective:    BP 110/72   Pulse 88   Temp 98.4 F (36.9 C) (Oral)   Ht 5' 6 (1.676 m)   Wt 164 lb 9.6 oz (74.7 kg)   SpO2 97%   BMI 26.57  kg/m    Physical Exam Vitals and nursing note reviewed. Exam conducted with a chaperone present Devere Hummer, CMA).  Constitutional:      Appearance: Normal appearance.  HENT:     Right Ear: Ear canal and external ear normal.     Left Ear: Tympanic membrane, ear canal and external ear normal.     Ears:     Comments: Clear fluid behind right TM    Nose:     Right Sinus: Maxillary sinus tenderness present. No frontal sinus tenderness.     Left Sinus: No maxillary sinus tenderness or frontal sinus tenderness.     Mouth/Throat:     Mouth: Mucous membranes are moist.     Pharynx: Oropharynx is clear.  Cardiovascular:     Rate and Rhythm: Normal rate and regular rhythm.     Heart sounds: Normal heart sounds.  Pulmonary:     Effort: Pulmonary effort is normal.     Breath sounds: Normal breath sounds.  Abdominal:     General: Bowel sounds are normal.     Tenderness: There is abdominal tenderness in the left lower quadrant. There is no right CVA tenderness or left CVA tenderness.  Genitourinary:  Lymphadenopathy:     Cervical: No cervical adenopathy.  Neurological:     Mental  Status: He is alert.     Results for orders placed or performed in visit on 08/07/23  POC COVID-19 BinaxNow  Result Value Ref Range   SARS Coronavirus 2 Ag Negative Negative  POCT Flu A & B Status  Result Value Ref Range   Influenza A, POC Negative Negative   Influenza B, POC Negative Negative        Assessment & Plan:   Problem List Items Addressed This Visit       Other   Dysuria   Longstanding history of the same will obtain UA if patient is able to urinate      Relevant Orders   Urinalysis w microscopic + reflex cultur   Lightheadedness   Will check CBC, CMP patient is getting adequate hydration per his report.  Will do Macrobid 0.5 mg 3 times daily as needed/precautions reviewed.  He states that moving his head and position changes make it worse      Relevant Medications    meclizine  (ANTIVERT ) 12.5 MG tablet   Other Relevant Orders   CBC   Comprehensive metabolic panel with GFR   High risk homosexual behavior   Recent receptive anal intercourse unsure of protection.  Will check her basic STIs inclusive of rectal gonorrhea chlamydia, HIV, syphilis.      Relevant Orders   RPR   HIV Antibody (routine testing w rflx)   C. trachomatis/N. gonorrhoeae RNA   Body aches   COVID and flu test in office.  Both came back negative      Relevant Orders   POC COVID-19 BinaxNow (Completed)   POCT Flu A & B Status (Completed)   Other Visit Diagnoses       Screening for diabetes mellitus    -  Primary   Relevant Orders   Hemoglobin A1c       Meds ordered this encounter  Medications   meclizine  (ANTIVERT ) 12.5 MG tablet    Sig: Take 1 tablet (12.5 mg total) by mouth 3 (three) times daily as needed for dizziness.    Dispense:  30 tablet    Refill:  0    Supervising Provider:   RANDEEN HARDY A [[id number]]    Return if symptoms worsen or fail to improve, for As scheduled .  Adina Crandall, NP

## 2023-08-08 ENCOUNTER — Emergency Department
Admission: EM | Admit: 2023-08-08 | Discharge: 2023-08-08 | Disposition: A | Discharge status: Home / Self Care | Attending: Emergency Medicine | Admitting: Emergency Medicine

## 2023-08-08 ENCOUNTER — Emergency Department

## 2023-08-08 ENCOUNTER — Other Ambulatory Visit: Payer: Self-pay

## 2023-08-08 DIAGNOSIS — K649 Unspecified hemorrhoids: Secondary | ICD-10-CM | POA: Insufficient documentation

## 2023-08-08 DIAGNOSIS — K6289 Other specified diseases of anus and rectum: Secondary | ICD-10-CM | POA: Diagnosis not present

## 2023-08-08 DIAGNOSIS — K625 Hemorrhage of anus and rectum: Secondary | ICD-10-CM | POA: Diagnosis not present

## 2023-08-08 LAB — URINALYSIS W MICROSCOPIC + REFLEX CULTURE
Bacteria, UA: NONE SEEN /HPF
Bilirubin Urine: NEGATIVE
Glucose, UA: NEGATIVE
Hgb urine dipstick: NEGATIVE
Hyaline Cast: NONE SEEN /LPF
Ketones, ur: NEGATIVE
Leukocyte Esterase: NEGATIVE
Nitrites, Initial: NEGATIVE
Protein, ur: NEGATIVE
RBC / HPF: NONE SEEN /HPF (ref 0–2)
Specific Gravity, Urine: 1.002 (ref 1.001–1.035)
Squamous Epithelial / HPF: NONE SEEN /HPF (ref <=5)
WBC, UA: NONE SEEN /HPF (ref 0–5)
pH: 7 (ref 5.0–8.0)

## 2023-08-08 LAB — RESP PANEL BY RT-PCR (RSV, FLU A&B, COVID)  RVPGX2
Influenza A by PCR: NEGATIVE
Influenza B by PCR: NEGATIVE
Resp Syncytial Virus by PCR: NEGATIVE
SARS Coronavirus 2 by RT PCR: NEGATIVE

## 2023-08-08 LAB — HIV ANTIBODY (ROUTINE TESTING W REFLEX): HIV 1&2 Ab, 4th Generation: NONREACTIVE

## 2023-08-08 LAB — RPR: RPR Ser Ql: NONREACTIVE

## 2023-08-08 LAB — C. TRACHOMATIS/N. GONORRHOEAE RNA
C. trachomatis RNA, TMA: NOT DETECTED
N. gonorrhoeae RNA, TMA: NOT DETECTED

## 2023-08-08 LAB — NO CULTURE INDICATED

## 2023-08-08 MED ORDER — HYDROCORTISONE ACETATE 25 MG RE SUPP: 25.0000 mg | Freq: Two times a day (BID) | RECTAL | 1 refills | Status: DC

## 2023-08-08 MED ORDER — DIBUCAINE (PERIANAL) 1 % EX OINT: 1.0000 | TOPICAL_OINTMENT | Freq: Three times a day (TID) | CUTANEOUS | 0 refills | Status: AC | PRN

## 2023-08-08 MED ORDER — ONDANSETRON 4 MG PO TBDP: 4.0000 mg | ORAL_TABLET | Freq: Three times a day (TID) | ORAL | 0 refills | Status: DC | PRN
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
## 2023-08-08 NOTE — Discharge Instructions (Signed)
 Use the prescription meds as directed. Follow-up with your PCP or GI medicine as discussed.
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
## 2023-08-09 DIAGNOSIS — Z419 Encounter for procedure for purposes other than remedying health state, unspecified: Secondary | ICD-10-CM | POA: Diagnosis not present

## 2023-08-12 ENCOUNTER — Ambulatory Visit: Payer: Self-pay | Admitting: Nurse Practitioner

## 2023-08-12 LAB — HSV CULTURE AND TYPING

## 2023-08-13 ENCOUNTER — Emergency Department

## 2023-08-13 ENCOUNTER — Other Ambulatory Visit: Payer: Self-pay

## 2023-08-13 DIAGNOSIS — R1011 Right upper quadrant pain: Secondary | ICD-10-CM | POA: Diagnosis present

## 2023-08-13 DIAGNOSIS — K5900 Constipation, unspecified: Secondary | ICD-10-CM | POA: Diagnosis not present

## 2023-08-13 DIAGNOSIS — R0789 Other chest pain: Secondary | ICD-10-CM | POA: Diagnosis not present

## 2023-08-13 DIAGNOSIS — R110 Nausea: Secondary | ICD-10-CM | POA: Diagnosis not present

## 2023-08-13 DIAGNOSIS — R079 Chest pain, unspecified: Secondary | ICD-10-CM | POA: Insufficient documentation

## 2023-08-13 LAB — COMPREHENSIVE METABOLIC PANEL WITH GFR
ALT: 16 U/L (ref 0–44)
AST: 19 U/L (ref 15–41)
Albumin: 4.1 g/dL (ref 3.5–5.0)
Alkaline Phosphatase: 51 U/L (ref 38–126)
Anion gap: 14 (ref 5–15)
BUN: 8 mg/dL (ref 6–20)
CO2: 25 mmol/L (ref 22–32)
Calcium: 9.4 mg/dL (ref 8.9–10.3)
Chloride: 103 mmol/L (ref 98–111)
Creatinine, Ser: 1.02 mg/dL (ref 0.61–1.24)
GFR, Estimated: >60 mL/min (ref >60)
Glucose, Bld: 102 mg/dL — ABNORMAL HIGH (ref 70–99)
Potassium: 3.5 mmol/L (ref 3.5–5.1)
Sodium: 142 mmol/L (ref 135–145)
Total Bilirubin: 0.9 mg/dL (ref 0.0–1.2)
Total Protein: 7 g/dL (ref 6.5–8.1)

## 2023-08-13 LAB — CBC
HCT: 45.2 % (ref 39.0–52.0)
Hemoglobin: 15.6 g/dL (ref 13.0–17.0)
MCH: 30.4 pg (ref 26.0–34.0)
MCHC: 34.5 g/dL (ref 30.0–36.0)
MCV: 87.9 fL (ref 80.0–100.0)
Platelets: 196 K/uL (ref 150–400)
RBC: 5.14 MIL/uL (ref 4.22–5.81)
RDW: 12.2 % (ref 11.5–15.5)
WBC: 7.6 K/uL (ref 4.0–10.5)
nRBC: 0 % (ref 0.0–0.2)

## 2023-08-13 LAB — LIPASE, BLOOD: Lipase: 28 U/L (ref 11–51)

## 2023-08-13 LAB — TROPONIN I (HIGH SENSITIVITY): Troponin I (High Sensitivity): 4 ng/L (ref <18)

## 2023-08-13 MED ORDER — ACETAMINOPHEN 325 MG PO TABS
650.0000 mg | ORAL_TABLET | Freq: Once | ORAL | Status: AC
  Administered 2023-08-13: 650 mg via ORAL
  Filled 2023-08-13: qty 2

## 2023-08-13 NOTE — ED Triage Notes (Signed)
 Pt reports over the past few days he has developed right side chest pain abd pain and rectal pain. Pt repots no BM in the past 2 days. Pt states he took mag citrate with minimal relief.

## 2023-08-14 ENCOUNTER — Emergency Department
Admission: EM | Admit: 2023-08-14 | Discharge: 2023-08-14 | Disposition: A | Discharge status: Home / Self Care | Attending: Emergency Medicine | Admitting: Emergency Medicine

## 2023-08-14 ENCOUNTER — Emergency Department

## 2023-08-14 DIAGNOSIS — K5900 Constipation, unspecified: Secondary | ICD-10-CM

## 2023-08-14 LAB — TROPONIN I (HIGH SENSITIVITY): Troponin I (High Sensitivity): 3 ng/L (ref <18)

## 2023-08-14 LAB — URINALYSIS, ROUTINE W REFLEX MICROSCOPIC
Bilirubin Urine: NEGATIVE
Glucose, UA: NEGATIVE mg/dL
Hgb urine dipstick: NEGATIVE
Ketones, ur: NEGATIVE mg/dL
Leukocytes,Ua: NEGATIVE
Nitrite: NEGATIVE
Protein, ur: NEGATIVE mg/dL
Specific Gravity, Urine: 1.006 (ref 1.005–1.030)
pH: 7 (ref 5.0–8.0)

## 2023-08-14 MED ORDER — SENNOSIDES-DOCUSATE SODIUM 8.6-50 MG PO TABS
1.0000 | ORAL_TABLET | Freq: Every day | ORAL | 0 refills | Status: AC
Start: 2023-08-14 — End: ?

## 2023-08-14 MED ORDER — KETOROLAC TROMETHAMINE 15 MG/ML IJ SOLN
15.0000 mg | Freq: Once | INTRAMUSCULAR | Status: AC
  Administered 2023-08-14: 15 mg via INTRAVENOUS
  Filled 2023-08-14: qty 1

## 2023-08-14 MED ORDER — ONDANSETRON HCL 4 MG/2ML IJ SOLN
4.0000 mg | Freq: Once | INTRAMUSCULAR | Status: AC
  Administered 2023-08-14: 4 mg via INTRAVENOUS
  Filled 2023-08-14: qty 2

## 2023-08-14 MED ORDER — DICYCLOMINE HCL 10 MG PO CAPS
10.0000 mg | ORAL_CAPSULE | Freq: Once | ORAL | Status: AC
  Administered 2023-08-14: 10 mg via ORAL
  Filled 2023-08-14: qty 1

## 2023-08-14 MED ORDER — IOHEXOL 300 MG/ML  SOLN
100.0000 mL | Freq: Once | INTRAMUSCULAR | Status: AC | PRN
  Administered 2023-08-14: 100 mL via INTRAVENOUS

## 2023-08-14 NOTE — ED Provider Notes (Signed)
 [facility] Provider Note    Event Date/Time   First MD Initiated Contact with Patient 08/14/23 0226     (approximate)   History   Abdominal Pain and Chest Pain   HPI Jesus Mason is a 23 y.o. male presenting today for abdominal pain.  Patient states being seen in the ED 6 days ago for constipation symptoms with largely reassuring workup.  He has not had a bowel movement in the past 2 days and use mag citrate without significant benefit.  Having worsening sharp pain throughout his abdomen but most prominent in the left lower quadrant and right upper quadrant.  Also associated nausea but no vomiting.  Intermittent associated right-sided chest pain.  Decreased p.o. intake.  Denies dysuria.  Chart review: Reviewed notes from last ED visit along with history of rectal pain as well.     Physical Exam   Triage Vital Signs: ED Triage Vitals  Encounter Vitals Group     BP 08/13/23 2125 127/78     Girls Systolic BP Percentile --      Girls Diastolic BP Percentile --      Boys Systolic BP Percentile --      Boys Diastolic BP Percentile --      Pulse Rate 08/13/23 2125 (!) 110     Resp 08/13/23 2125 15     Temp 08/13/23 2125 99 F (37.2 C)     Temp src --      SpO2 08/13/23 2125 100 %     Weight 08/13/23 2123 154 lb (69.9 kg)     Height 08/13/23 2123 5' 6 (1.676 m)     Head Circumference --      Peak Flow --      Pain Score 08/13/23 2123 8     Pain Loc --      Pain Education --      Exclude from Growth Chart --     Most recent vital signs: Vitals:   08/13/23 2125 08/13/23 2344  BP: 127/78 121/79  Pulse: (!) 110 80  Resp: 15 16  Temp: 99 F (37.2 C)   SpO2: 100% 100%   Physical Exam: I have reviewed the vital signs and nursing notes. General: Awake, alert, no acute distress.  Nontoxic appearing. Head:  Atraumatic, normocephalic.   ENT:  EOM intact, PERRL. Oral mucosa is pink and moist with no lesions. Neck: Neck is supple with full  range of motion,  Cardiovascular:  RRR, No murmurs. Peripheral pulses palpable and equal bilaterally. Respiratory:  Symmetrical chest wall expansion.  No rhonchi, rales, or wheezes.  Good air movement throughout.  No use of accessory muscles.   Musculoskeletal:  No cyanosis or edema. Moving extremities with full ROM Abdomen:  Soft, tenderness palpation right upper quadrant and left lower quadrant most prominently, nondistended. Neuro:  GCS 15, moving all four extremities, interacting appropriately. Speech clear. Psych:  Calm, appropriate.   Skin:  Warm, dry, no rash.    ED Results / Procedures / Treatments   Labs (all labs ordered are listed, but only abnormal results are displayed) Labs Reviewed  COMPREHENSIVE METABOLIC PANEL WITH GFR - Abnormal; Notable for the following components:      Result Value   Glucose, Bld 102 (*)    All other components within normal limits  URINALYSIS, ROUTINE W REFLEX MICROSCOPIC - Abnormal; Notable for the following components:   Color, Urine YELLOW (*)    APPearance CLEAR (*)    All other  components within normal limits  CBC  LIPASE, BLOOD  TROPONIN I (HIGH SENSITIVITY)  TROPONIN I (HIGH SENSITIVITY)     EKG My EKG interpretation: Rate of 105, sinus tachycardia, normal axis, normal intervals.  No acute ST elevations or depressions   RADIOLOGY Independently interpreted chest x-ray and abdominal x-ray with no acute findings.  Independently interpreted CT abdomen/pelvis with no acute findings   PROCEDURES:  Critical Care performed: No  Procedures   MEDICATIONS ORDERED IN ED: Medications  dicyclomine  (BENTYL ) capsule 10 mg (has no administration in time range)  acetaminophen  (TYLENOL ) tablet 650 mg (650 mg Oral Given 08/13/23 2350)  ondansetron  (ZOFRAN ) injection 4 mg (4 mg Intravenous Given 08/14/23 0317)  ketorolac  (TORADOL ) 15 MG/ML injection 15 mg (15 mg Intravenous Given 08/14/23 0317)  iohexol  (OMNIPAQUE ) 300 MG/ML solution 100 mL  (100 mLs Intravenous Contrast Given 08/14/23 0323)     IMPRESSION / MDM / ASSESSMENT AND PLAN / ED COURSE  I reviewed the triage vital signs and the nursing notes.                              Differential diagnosis includes, but is not limited to, constipation, diverticulitis, SBO, cholecystitis  Patient's presentation is most consistent with acute complicated illness / injury requiring diagnostic workup.  Patient is a 23 year old male presenting today for generalized abdominal pain which is most prominent in the left lower quadrant and right upper quadrants associated with nausea and constipation.  Tenderness on exam and the specified reasons above.  Otherwise reassuring examination and stable vital signs.  Patient reported some chest pain as well on the right side but EKG and 2 troponins are negative for any ischemic pathology.  Laboratory workup with CBC, CMP, lipase, UA all negative.  Chest x-ray and abdominal x-ray rather reassuring.  Given recurrence of symptoms with second ED visit for the same, we will get CT imaging for further evaluation.  Patient given Toradol  and Zofran  for pain and nausea symptoms.  CT abdomen/pelvis shows no acute findings.  Suspect symptoms are all related to constipation with slow transit.  Will start on Peri-Colace and MiraLAX for treatment and have him follow-up with PCP.  Otherwise given strict return precautions.     FINAL CLINICAL IMPRESSION(S) / ED DIAGNOSES   Final diagnoses:  Constipation, unspecified constipation type     Rx / DC Orders   ED Discharge Orders          Ordered    senna-docusate (SENOKOT-S) 8.6-50 MG tablet  Daily        08/14/23 0443             Note:  This document was prepared using Dragon voice recognition software and may include unintentional dictation errors.   Malvina Alm DASEN, MD 08/14/23 [phone]

## 2023-08-14 NOTE — Discharge Instructions (Signed)
 CT imaging is reassuring today and blood work is also reassuring.  Suspect most likely constipation with slow transit.  I have sent a medication to your pharmacy which you should take once daily in the morning.  You should also pick up MiraLAX over-the-counter and take 2 capfuls in 8 to 12 ounces of fluid each morning.  The goal is to have 1 bowel movement per day which is soft.  As your bowel movements are becoming more frequent, you can decrease either the pill or MiraLAX.  Please follow-up with your primary care provider.

## 2023-08-19 NOTE — Telephone Encounter (Signed)
 Lvm to schedule with any provider with an opening.

## 2023-08-19 NOTE — Telephone Encounter (Signed)
 Needs office visit.

## 2023-08-28 ENCOUNTER — Ambulatory Visit: Admitting: Nurse Practitioner

## 2023-08-28 VITALS — BP 114/82 | HR 62 | Temp 97.7°F | Ht 66.0 in | Wt 165.8 lb

## 2023-08-28 DIAGNOSIS — R1011 Right upper quadrant pain: Secondary | ICD-10-CM | POA: Insufficient documentation

## 2023-08-28 DIAGNOSIS — R109 Unspecified abdominal pain: Secondary | ICD-10-CM

## 2023-08-28 DIAGNOSIS — R300 Dysuria: Secondary | ICD-10-CM | POA: Diagnosis not present

## 2023-08-28 DIAGNOSIS — R110 Nausea: Secondary | ICD-10-CM | POA: Diagnosis not present

## 2023-08-28 LAB — COMPREHENSIVE METABOLIC PANEL WITH GFR
ALT: 18 U/L (ref 0–53)
AST: 15 U/L (ref 0–37)
Albumin: 4.5 g/dL (ref 3.5–5.2)
Alkaline Phosphatase: 51 U/L (ref 39–117)
BUN: 7 mg/dL (ref 6–23)
CO2: 30 meq/L (ref 19–32)
Calcium: 9.1 mg/dL (ref 8.4–10.5)
Chloride: 103 meq/L (ref 96–112)
Creatinine, Ser: 1 mg/dL (ref 0.40–1.50)
GFR: 106.57 mL/min (ref >60.00)
Glucose, Bld: 87 mg/dL (ref 70–99)
Potassium: 3.9 meq/L (ref 3.5–5.1)
Sodium: 138 meq/L (ref 135–145)
Total Bilirubin: 0.6 mg/dL (ref 0.2–1.2)
Total Protein: 6.8 g/dL (ref 6.0–8.3)

## 2023-08-28 LAB — POCT URINALYSIS DIPSTICK
Bilirubin, UA: NEGATIVE
Blood, UA: NEGATIVE
Glucose, UA: NEGATIVE
Ketones, UA: NEGATIVE
Leukocytes, UA: NEGATIVE
Nitrite, UA: NEGATIVE
Protein, UA: NEGATIVE
Spec Grav, UA: <=1.005 — AB (ref 1.010–1.025)
Urobilinogen, UA: 0.2 U/dL
pH, UA: 6.5 (ref 5.0–8.0)

## 2023-08-28 LAB — CBC
HCT: 42.8 % (ref 39.0–52.0)
Hemoglobin: 14.5 g/dL (ref 13.0–17.0)
MCHC: 33.8 g/dL (ref 30.0–36.0)
MCV: 88.9 fl (ref 78.0–100.0)
Platelets: 176 K/uL (ref 150.0–400.0)
RBC: 4.82 Mil/uL (ref 4.22–5.81)
RDW: 13.5 % (ref 11.5–15.5)
WBC: 6.5 K/uL (ref 4.0–10.5)

## 2023-08-28 LAB — LIPASE: Lipase: 20 U/L (ref 11.0–59.0)

## 2023-08-28 MED ORDER — ONDANSETRON 4 MG PO TBDP: 4.0000 mg | ORAL_TABLET | Freq: Three times a day (TID) | ORAL | 0 refills | Status: DC | PRN

## 2023-08-28 NOTE — Assessment & Plan Note (Signed)
 Refill Zofran  4 mg 3 times daily as needed

## 2023-08-28 NOTE — Patient Instructions (Signed)
 Nice to see you today I have sent in some zofran  Follow up if you do not improve   You can take ibuprofen  800mg  every 8 hours try not to take more than 7-10 days in a row

## 2023-08-28 NOTE — Assessment & Plan Note (Signed)
 Musculoskeletal nature.  Patient continue taking over-the-counter analgesics as needed rest can also use heat and ice.
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
## 2023-08-28 NOTE — Assessment & Plan Note (Signed)
 UA in office

## 2023-08-28 NOTE — Assessment & Plan Note (Signed)
 Incidental finding on exam check CMP, CBC, lipase normal today with nausea.  Patient is not having any early satiety no epigastric pain.  Is not correlated with consumption of food or fluid

## 2023-08-29 ENCOUNTER — Ambulatory Visit: Payer: Self-pay | Admitting: Nurse Practitioner

## 2023-09-09 DIAGNOSIS — Z419 Encounter for procedure for purposes other than remedying health state, unspecified: Secondary | ICD-10-CM | POA: Diagnosis not present

## 2023-09-24 ENCOUNTER — Emergency Department
Admission: EM | Admit: 2023-09-24 | Discharge: 2023-09-24 | Disposition: A | Discharge status: Home / Self Care | Attending: Emergency Medicine | Admitting: Emergency Medicine

## 2023-09-24 ENCOUNTER — Other Ambulatory Visit: Payer: Self-pay

## 2023-09-24 DIAGNOSIS — Z202 Contact with and (suspected) exposure to infections with a predominantly sexual mode of transmission: Secondary | ICD-10-CM | POA: Diagnosis not present

## 2023-09-24 LAB — URINALYSIS, ROUTINE W REFLEX MICROSCOPIC
Bilirubin Urine: NEGATIVE
Glucose, UA: NEGATIVE mg/dL
Hgb urine dipstick: NEGATIVE
Ketones, ur: NEGATIVE mg/dL
Leukocytes,Ua: NEGATIVE
Nitrite: NEGATIVE
Protein, ur: NEGATIVE mg/dL
Specific Gravity, Urine: 1.005 (ref 1.005–1.030)
pH: 7 (ref 5.0–8.0)

## 2023-09-24 LAB — CHLAMYDIA/NGC RT PCR (ARMC ONLY)
Chlamydia Tr: NOT DETECTED
N gonorrhoeae: NOT DETECTED
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
## 2023-09-24 NOTE — Discharge Instructions (Addendum)
 Your chlamydia and gonorrhea is pending.  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  If any of these results are positive you will receive a call.  In the interim follow-up with your primary care provider for further evaluation and education on sexually transmitted diseases and infections.
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
## 2023-09-26 LAB — HSV 2 ANTIBODY, IGG: HSV 2 Glycoprotein G Ab, IgG: NONREACTIVE

## 2023-10-10 DIAGNOSIS — Z419 Encounter for procedure for purposes other than remedying health state, unspecified: Secondary | ICD-10-CM | POA: Diagnosis not present

## 2023-10-30 ENCOUNTER — Ambulatory Visit: Payer: Self-pay | Admitting: Nurse Practitioner

## 2023-11-26 ENCOUNTER — Encounter: Payer: Medicaid Other | Admitting: Nurse Practitioner

## 2023-12-24 ENCOUNTER — Emergency Department
Admission: EM | Admit: 2023-12-24 | Discharge: 2023-12-24 | Disposition: A | Discharge status: Home / Self Care | Attending: Emergency Medicine | Admitting: Emergency Medicine

## 2023-12-24 ENCOUNTER — Encounter: Payer: Self-pay | Admitting: Nurse Practitioner

## 2023-12-24 ENCOUNTER — Other Ambulatory Visit: Payer: Self-pay

## 2023-12-24 ENCOUNTER — Encounter: Payer: Self-pay | Admitting: Intensive Care

## 2023-12-24 DIAGNOSIS — Z113 Encounter for screening for infections with a predominantly sexual mode of transmission: Secondary | ICD-10-CM

## 2023-12-24 DIAGNOSIS — Z202 Contact with and (suspected) exposure to infections with a predominantly sexual mode of transmission: Secondary | ICD-10-CM | POA: Diagnosis not present

## 2023-12-24 DIAGNOSIS — R21 Rash and other nonspecific skin eruption: Secondary | ICD-10-CM | POA: Insufficient documentation

## 2023-12-24 LAB — URINALYSIS, ROUTINE W REFLEX MICROSCOPIC
Bilirubin Urine: NEGATIVE
Glucose, UA: NEGATIVE mg/dL
Ketones, ur: 5 mg/dL — AB
Leukocytes,Ua: NEGATIVE
Nitrite: NEGATIVE
Protein, ur: NEGATIVE mg/dL
Specific Gravity, Urine: 1.012 (ref 1.005–1.030)
Squamous Epithelial / HPF: 0 /HPF (ref 0–5)
pH: 6 (ref 5.0–8.0)

## 2023-12-24 LAB — CHLAMYDIA/NGC RT PCR (ARMC ONLY)
Chlamydia Tr: NOT DETECTED
N gonorrhoeae: NOT DETECTED

## 2023-12-24 LAB — RAPID HIV SCREEN (HIV 1/2 AB+AG)
HIV 1/2 Antibodies: NONREACTIVE
HIV-1 P24 Antigen - HIV24: NONREACTIVE

## 2023-12-24 NOTE — ED Triage Notes (Signed)
 Patient reports he would like HSV testing and STD testing. Patient reports bumps on buttocks/testicles and reddish rash on hands.   Denies discharge from private area

## 2023-12-24 NOTE — ED Provider Notes (Signed)
 [facility] Emergency Department Provider Note     Event Date/Time   First MD Initiated Contact with Patient 12/24/23 1707     (approximate)   History   SEXUALLY TRANSMITTED DISEASE   HPI  Jesus Mason is a 23 y.o. male presents to the ED requesting HSV testing and full STD screening.  Reports bumps on the buttocks, testicles and a reddish rash on the hands and bilateral feet.  Denies penile discharge, dysuria, abdominal pain, fever or systemic symptoms.  Patient has been evaluated multiple times for similar concerns and has had prior STD STI panels including HSV, HIV and syphilis which have been negative.  History of MSM sexual activity.  Patient shows photos of bumps on the buttocks.  Denies painful lesions.  No vesicles.     Physical Exam   Triage Vital Signs: ED Triage Vitals  Encounter Vitals Group     BP 12/24/23 1553 (!) 145/93     Girls Systolic BP Percentile --      Girls Diastolic BP Percentile --      Boys Systolic BP Percentile --      Boys Diastolic BP Percentile --      Pulse Rate 12/24/23 1553 73     Resp 12/24/23 1553 18     Temp 12/24/23 1553 98.6 F (37 C)     Temp Source 12/24/23 1553 Oral     SpO2 12/24/23 1553 98 %     Weight 12/24/23 1553 155 lb (70.3 kg)     Height 12/24/23 1553 5' 6 (1.676 m)     Head Circumference --      Peak Flow --      Pain Score 12/24/23 1556 0     Pain Loc --      Pain Education --      Exclude from Growth Chart --     Most recent vital signs: Vitals:   12/24/23 1553 12/24/23 1710  BP: (!) 145/93   Pulse: 73   Resp: 18   Temp: 98.6 F (37 C)   SpO2: 98% 98%    General Awake, no distress.  Well-appearing HEENT NCAT.  CV:  Good peripheral perfusion.  RESP:  Normal effort.  ABD:  No distention.  Other:  Nonspecific blanchable red scattered spots on knuckles of left hand no rash noted on right.  No appreciated rash to bilateral ankles.  No oral involvement.  Patient  deferred GU exam but showed media via phone of what appears to be folliculitis appearing lesions with external hemorrhoids to rectum area.  No vesicles or ulcerations.  Penis was also showed and no abnormal lesions noted.    ED Results / Procedures / Treatments   Labs (all labs ordered are listed, but only abnormal results are displayed) Labs Reviewed  URINALYSIS, ROUTINE W REFLEX MICROSCOPIC - Abnormal; Notable for the following components:      Result Value   Color, Urine YELLOW (*)    APPearance CLEAR (*)    Hgb urine dipstick SMALL (*)    Ketones, ur 5 (*)    Bacteria, UA RARE (*)    All other components within normal limits  CHLAMYDIA/NGC RT PCR ([facility] ONLY)              No results found.  PROCEDURES:  Critical Care performed: No  Procedures   MEDICATIONS ORDERED IN ED: Medications - No data to display   IMPRESSION / MDM / ASSESSMENT AND PLAN / ED  COURSE  I reviewed the triage vital signs and the nursing notes.                               23 y.o. male presents to the emergency department for evaluation and treatment of STD STI screening. See HPI for further details.   Differential diagnosis includes, but is not limited to STD, STI, syphilis, folliculitis, genitalia herpes, contact dermatitis  Patient's presentation is most consistent with acute complicated illness / injury requiring diagnostic workup.  Patient with chronic, recurrent concerns about STDs and STIs presenting today with nonspecific bumps on buttocks, testicles hand and feet.  Exam and photos reviewed showed scattered follicular lesions without vesicular grouping, ulceration or tenderness making HSV unlikely.  No findings consistent with syphilis, gonorrhea or chlamydia.  Patient has had multiple prior negative STD evaluations, raising possibility of health anxiety related to STDs.  Reassured patient's of my low suspicion for acute ST ED based on exam.  Despite low clinical concern, patient desires  screening.  Offered repeat HIV, syphilis, gonorrhea, chlamydia and HSV per patient request.  Provided reassurance regarding absence of concerning lesions at this visit.  Provided counseling on skin hygiene and when to return for new or painful lesion.  Patient stable for discharge.   FINAL CLINICAL IMPRESSION(S) / ED DIAGNOSES   Final diagnoses:  None     Rx / DC Orders   ED Discharge Orders     None        Note:  This document was prepared using Dragon voice recognition software and may include unintentional dictation errors.    Margrette, Jossette Zirbel A, PA-C 12/24/23 2313    Jacolyn Pae, MD 12/27/23 0004

## 2023-12-24 NOTE — ED Notes (Signed)
 PT encouraged to give a urine sample

## 2023-12-24 NOTE — Discharge Instructions (Signed)
 Please follow-up with dermatology for further evaluation of rash and management.  For more information and education of sexually transmitted disease please follow-up with the health department.

## 2023-12-25 LAB — SYPHILIS: RPR W/REFLEX TO RPR TITER AND TREPONEMAL ANTIBODIES, TRADITIONAL SCREENING AND DIAGNOSIS ALGORITHM: RPR Ser Ql: NONREACTIVE

## 2023-12-27 LAB — HSV 2 ANTIBODY, IGG: HSV 2 Glycoprotein G Ab, IgG: REACTIVE — AB

## 2023-12-29 ENCOUNTER — Ambulatory Visit: Payer: Self-pay | Admitting: Emergency Medicine

## 2023-12-30 ENCOUNTER — Telehealth: Payer: Self-pay | Admitting: Nurse Practitioner

## 2023-12-30 ENCOUNTER — Encounter: Payer: Self-pay | Admitting: Nurse Practitioner

## 2023-12-30 NOTE — Telephone Encounter (Signed)
 Copied from CRM #[id number]. Topic: Clinical - Lab/Test Results >> Dec 29, 2023  3:57 PM Larissa S wrote: Reason for CRM: Patient requesting a callback from PCP to discuss  hospital lab results.

## 2023-12-30 NOTE — Telephone Encounter (Signed)
 Sent a my chart message.

## 2024-01-01 NOTE — Telephone Encounter (Signed)
 Patient was seen at urgent care and referred to Dermatology for rash.   Can we see if he has been contacted or made an appointment.   He is concerned for STI outbreak. He needs a follow up with me in office

## 2024-01-02 NOTE — Telephone Encounter (Signed)
 Left voicemail for patient to call the office back.

## 2024-01-05 ENCOUNTER — Other Ambulatory Visit: Payer: Self-pay

## 2024-01-05 ENCOUNTER — Emergency Department: Admission: EM | Admit: 2024-01-05 | Discharge: 2024-01-05 | Disposition: A | Discharge status: Home / Self Care

## 2024-01-05 DIAGNOSIS — L02416 Cutaneous abscess of left lower limb: Secondary | ICD-10-CM | POA: Diagnosis not present

## 2024-01-05 DIAGNOSIS — L0291 Cutaneous abscess, unspecified: Secondary | ICD-10-CM

## 2024-01-05 LAB — LACTIC ACID, PLASMA
Lactic Acid, Venous: 2.1 mmol/L (ref 0.5–1.9)
Lactic Acid, Venous: 0.7 mmol/L (ref 0.5–1.9)

## 2024-01-05 LAB — CBC WITH DIFFERENTIAL/PLATELET
Abs Immature Granulocytes: 0.02 K/uL (ref 0.00–0.07)
Basophils Absolute: 0 K/uL (ref 0.0–0.1)
Basophils Relative: 0 %
Eosinophils Absolute: 0.2 K/uL (ref 0.0–0.5)
Eosinophils Relative: 2 %
HCT: 43.6 % (ref 39.0–52.0)
Hemoglobin: 15 g/dL (ref 13.0–17.0)
Immature Granulocytes: 0 %
Lymphocytes Relative: 29 %
Lymphs Abs: 2.9 K/uL (ref 0.7–4.0)
MCH: 30.1 pg (ref 26.0–34.0)
MCHC: 34.4 g/dL (ref 30.0–36.0)
MCV: 87.4 fL (ref 80.0–100.0)
Monocytes Absolute: 0.9 K/uL (ref 0.1–1.0)
Monocytes Relative: 9 %
Neutro Abs: 5.9 K/uL (ref 1.7–7.7)
Neutrophils Relative %: 60 %
Platelets: 182 K/uL (ref 150–400)
RBC: 4.99 MIL/uL (ref 4.22–5.81)
RDW: 12.2 % (ref 11.5–15.5)
WBC: 9.9 K/uL (ref 4.0–10.5)
nRBC: 0 % (ref 0.0–0.2)

## 2024-01-05 LAB — COMPREHENSIVE METABOLIC PANEL WITH GFR
ALT: 8 U/L (ref 0–44)
AST: 15 U/L (ref 15–41)
Albumin: 4.3 g/dL (ref 3.5–5.0)
Alkaline Phosphatase: 59 U/L (ref 38–126)
Anion gap: 10 (ref 5–15)
BUN: 13 mg/dL (ref 6–20)
CO2: 28 mmol/L (ref 22–32)
Calcium: 9.5 mg/dL (ref 8.9–10.3)
Chloride: 103 mmol/L (ref 98–111)
Creatinine, Ser: 1.1 mg/dL (ref 0.61–1.24)
GFR, Estimated: >60 mL/min (ref >60)
Glucose, Bld: 101 mg/dL — ABNORMAL HIGH (ref 70–99)
Potassium: 4.2 mmol/L (ref 3.5–5.1)
Sodium: 141 mmol/L (ref 135–145)
Total Bilirubin: 0.4 mg/dL (ref 0.0–1.2)
Total Protein: 6.6 g/dL (ref 6.5–8.1)

## 2024-01-05 MED ORDER — SODIUM CHLORIDE 0.9 % IV BOLUS
1000.0000 mL | Freq: Once | INTRAVENOUS | Status: AC
  Administered 2024-01-05: 1000 mL via INTRAVENOUS

## 2024-01-05 MED ORDER — SODIUM CHLORIDE 0.9 % IV SOLN
2.0000 g | INTRAVENOUS | Status: DC
  Administered 2024-01-05: 2 g via INTRAVENOUS
  Filled 2024-01-05: qty 20

## 2024-01-05 MED ORDER — KETOROLAC TROMETHAMINE 15 MG/ML IJ SOLN
15.0000 mg | Freq: Once | INTRAMUSCULAR | Status: AC
  Administered 2024-01-05: 15 mg via INTRAVENOUS
  Filled 2024-01-05: qty 1

## 2024-01-05 MED ORDER — CEPHALEXIN 500 MG PO CAPS: 500.0000 mg | ORAL_CAPSULE | Freq: Four times a day (QID) | ORAL | 0 refills | Status: AC

## 2024-01-05 MED ORDER — DOXYCYCLINE MONOHYDRATE 100 MG PO TABS: 100.0000 mg | ORAL_TABLET | Freq: Two times a day (BID) | ORAL | 0 refills | Status: AC

## 2024-01-05 NOTE — Discharge Instructions (Signed)
 Please take the antibiotics as prescribed.  Please return for any new, worsening, or changing symptoms or other concerns or if the redness spreads beyond the outlined area or if you develop fever or any other concerns.  Follow-up with your outpatient provider in the next couple of days for recheck.  It was a pleasure caring for you today.

## 2024-01-05 NOTE — ED Notes (Signed)
 See triage note  Presents with a possible abscess area to left upper leg  States he felt is about 1 week ago  Then developed pain on Friday  Noticed some drainage this am  Afebrile on arrival

## 2024-01-05 NOTE — ED Triage Notes (Signed)
 Pt comes in via pov with complaints of an abscess on his left lower buttocks that he noticed on Friday. Pt states that it has been having a little drainage from the area since Saturday night. Pt complains of pain 6/10 at this time.

## 2024-01-05 NOTE — ED Provider Notes (Signed)
 [facility] Provider Note    Event Date/Time   First MD Initiated Contact with Patient 01/05/24 1034     (approximate)   History   Abscess   HPI  Jesus Mason is a 23 y.o. male who presents today for evaluation of abscess to his left thigh.  Patient reports that this began 3 days ago and 2 days ago he had a small amount of drainage.  He has not been on any antibiotics.  No fevers or chills.  Patient Active Problem List   Diagnosis Date Noted   RUQ pain 08/28/2023   Nausea 08/28/2023   High risk homosexual behavior 08/07/2023   Body aches 08/07/2023   Paresthesia 05/09/2023   Hospital discharge follow-up 05/09/2023   Lightheadedness 05/09/2023   Epigastric pain 05/09/2023   Flank pain 12/04/2022   Dysuria 12/04/2022   History of kidney stones 12/04/2022   Pain in left testicle 12/04/2022   Rectal mass 11/25/2022   Encounter for HIV pre-exposure prophylaxis 11/25/2022   Hemorrhoids 10/14/2022   Preventative health care 10/14/2022   GAD (generalized anxiety disorder) 10/14/2022   Concentration deficit 10/14/2022          Physical Exam   Triage Vital Signs: ED Triage Vitals  Encounter Vitals Group     BP 01/05/24 1024 123/78     Girls Systolic BP Percentile --      Girls Diastolic BP Percentile --      Boys Systolic BP Percentile --      Boys Diastolic BP Percentile --      Pulse Rate 01/05/24 1024 85     Resp 01/05/24 1024 17     Temp 01/05/24 1024 98.5 F (36.9 C)     Temp src --      SpO2 01/05/24 1024 100 %     Weight 01/05/24 1025 154 lb 15.7 oz (70.3 kg)     Height 01/05/24 1025 5' 6 (1.676 m)     Head Circumference --      Peak Flow --      Pain Score 01/05/24 1025 6     Pain Loc --      Pain Education --      Exclude from Growth Chart --     Most recent vital signs: Vitals:   01/05/24 1024  BP: 123/78  Pulse: 85  Resp: 17  Temp: 98.5 F (36.9 C)  SpO2: 100%    Physical Exam Vitals and nursing  note reviewed.  Constitutional:      General: Awake and alert. No acute distress.    Appearance: Normal appearance. The patient is normal weight.  HENT:     Head: Normocephalic and atraumatic.     Mouth: Mucous membranes are moist.  Eyes:     General: PERRL. Normal EOMs        Right eye: No discharge.        Left eye: No discharge.     Conjunctiva/sclera: Conjunctivae normal.  Cardiovascular:     Rate and Rhythm: Normal rate and regular rhythm.     Pulses: Normal pulses.  Pulmonary:     Effort: Pulmonary effort is normal. No respiratory distress.     Breath sounds: Normal breath sounds.  Abdominal:     Abdomen is soft. There is no abdominal tenderness. No rebound or guarding. No distention. Musculoskeletal:        General: No swelling. Normal range of motion.     Cervical back: Normal range of  motion and neck supple.  Skin:    General: Skin is warm and dry.     Capillary Refill: Capillary refill takes less than 2 seconds.     Findings: Left lateral leg with 3 x 3 cm area of erythema with central induration and scab.  No fluctuance palpated.  No lymphangitis.  No crepitus.  Minimally tender to palpation. Neurological:     Mental Status: The patient is awake and alert.      ED Results / Procedures / Treatments   Labs (all labs ordered are listed, but only abnormal results are displayed) Labs Reviewed  COMPREHENSIVE METABOLIC PANEL WITH GFR - Abnormal; Notable for the following components:      Result Value   Glucose, Bld 101 (*)    All other components within normal limits  LACTIC ACID, PLASMA - Abnormal; Notable for the following components:   Lactic Acid, Venous 2.1 (*)    All other components within normal limits  CBC WITH DIFFERENTIAL/PLATELET  LACTIC ACID, PLASMA  LACTIC ACID, PLASMA     EKG     RADIOLOGY     PROCEDURES:  Critical Care performed:   Procedures   MEDICATIONS ORDERED IN ED: Medications  cefTRIAXone  (ROCEPHIN ) 2 g in sodium chloride   0.9 % 100 mL IVPB (0 g Intravenous Stopped 01/05/24 1259)  sodium chloride  0.9 % bolus 1,000 mL (0 mLs Intravenous Stopped 01/05/24 1359)  ketorolac  (TORADOL ) 15 MG/ML injection 15 mg (15 mg Intravenous Given 01/05/24 1304)     IMPRESSION / MDM / ASSESSMENT AND PLAN / ED COURSE  I reviewed the triage vital signs and the nursing notes.   Differential diagnosis includes, but is not limited to, cellulitis, abscess, furuncle.  Patient is awake and alert, hemodynamically stable and afebrile.  Blood work obtained in triage revealed no leukocytosis.  He would have minimally elevated lactate, therefore was treated with 1 dose of Rocephin  and a liter of IV fluids and upon recheck this had normalized.  I do not suspect sepsis.  He has no fever or shaking chills, no lymphangitis.  Will treat with oral antibiotics.  There is no fluctuance to suggest abscess.  The area is quite indurated.  There is no joint involvement.  We discussed return precautions and outpatient follow-up.  Patient understands and agrees with plan.  Discharged in stable condition.   Patient's presentation is most consistent with acute complicated illness / injury requiring diagnostic workup.     FINAL CLINICAL IMPRESSION(S) / ED DIAGNOSES   Final diagnoses:  Abscess     Rx / DC Orders   ED Discharge Orders          Ordered    cephALEXin  (KEFLEX ) 500 MG capsule  4 times daily        01/05/24 1352    doxycycline  (ADOXA) 100 MG tablet  2 times daily        01/05/24 1352             Note:  This document was prepared using Dragon voice recognition software and may include unintentional dictation errors.   Sedrick Tober E, PA-C 01/05/24 1408    Clarine Ozell LABOR, MD 01/06/24 [phone]

## 2024-01-09 DIAGNOSIS — Z419 Encounter for procedure for purposes other than remedying health state, unspecified: Secondary | ICD-10-CM | POA: Diagnosis not present

## 2024-01-14 ENCOUNTER — Inpatient Hospital Stay: Admitting: Nurse Practitioner

## 2024-01-14 NOTE — Progress Notes (Deleted)
° °  Established Patient Office Visit  Subjective   Patient ID: CHADEN DOOM, male    DOB: [birth year]  Age: 23 y.o. MRN: [id number]  No chief complaint on file.   HPI  Patient was seen in the emergency department on 01/05/2024.  Patient had an abscess of his left thigh reports that began 3 days prior to the visit.  Patient was given Rocephin  2 g IV and some ketorolac .  Patient also given a liter of fluid patient was treated with oral antibiotics and discharge.  He was placed on doxycycline  100 mg twice daily and is here for follow-up  {History (Optional):23778}  ROS    Objective:     There were no vitals taken for this visit. {Vitals History (Optional):23777}  Physical Exam   No results found for any visits on 01/14/24.  {Labs (Optional):23779}  The ASCVD Risk score (Arnett DK, et al., 2019) failed to calculate for the following reasons:   The 2019 ASCVD risk score is only valid for ages 52 to 11   * - Cholesterol units were assumed    Assessment & Plan:   Problem List Items Addressed This Visit   None   No follow-ups on file.    Adina Crandall, NP

## 2024-02-10 DIAGNOSIS — R1310 Dysphagia, unspecified: Secondary | ICD-10-CM | POA: Diagnosis not present

## 2024-02-10 DIAGNOSIS — K6289 Other specified diseases of anus and rectum: Secondary | ICD-10-CM | POA: Diagnosis present

## 2024-02-10 DIAGNOSIS — K602 Anal fissure, unspecified: Secondary | ICD-10-CM | POA: Diagnosis not present

## 2024-02-10 DIAGNOSIS — K644 Residual hemorrhoidal skin tags: Secondary | ICD-10-CM | POA: Diagnosis not present

## 2024-02-10 NOTE — ED Triage Notes (Signed)
 Pt reports rectal pain since yesterday. Pt reports hx of fissure. Pt also reports he has a bulge with foul odor and discharge at his rectum. Pt is HSV+

## 2024-02-11 ENCOUNTER — Encounter: Payer: Self-pay | Admitting: Physician Assistant

## 2024-02-11 ENCOUNTER — Ambulatory Visit: Admitting: Physician Assistant

## 2024-02-11 ENCOUNTER — Ambulatory Visit: Payer: Self-pay

## 2024-02-11 ENCOUNTER — Emergency Department
Admission: EM | Admit: 2024-02-11 | Discharge: 2024-02-11 | Disposition: A | Attending: Emergency Medicine | Admitting: Emergency Medicine

## 2024-02-11 ENCOUNTER — Other Ambulatory Visit: Payer: Self-pay

## 2024-02-11 VITALS — BP 147/102 | HR 114

## 2024-02-11 DIAGNOSIS — R1310 Dysphagia, unspecified: Secondary | ICD-10-CM

## 2024-02-11 DIAGNOSIS — B001 Herpesviral vesicular dermatitis: Secondary | ICD-10-CM

## 2024-02-11 DIAGNOSIS — K649 Unspecified hemorrhoids: Secondary | ICD-10-CM

## 2024-02-11 DIAGNOSIS — K644 Residual hemorrhoidal skin tags: Secondary | ICD-10-CM

## 2024-02-11 DIAGNOSIS — K602 Anal fissure, unspecified: Secondary | ICD-10-CM

## 2024-02-11 DIAGNOSIS — F411 Generalized anxiety disorder: Secondary | ICD-10-CM | POA: Diagnosis not present

## 2024-02-11 DIAGNOSIS — K219 Gastro-esophageal reflux disease without esophagitis: Secondary | ICD-10-CM

## 2024-02-11 MED ORDER — DOCUSATE SODIUM 100 MG PO CAPS: 100.0000 mg | ORAL_CAPSULE | Freq: Two times a day (BID) | ORAL | 0 refills | Status: AC

## 2024-02-11 MED ORDER — NIFEDIPINE 0.3 % OINTMENT: 1.0000 | TOPICAL_OINTMENT | Freq: Four times a day (QID) | CUTANEOUS | 0 refills | Status: DC

## 2024-02-11 MED ORDER — LIDOCAINE-PRILOCAINE 2.5-2.5 % EX CREA
TOPICAL_CREAM | Freq: Once | CUTANEOUS | Status: AC
  Filled 2024-02-11: qty 5

## 2024-02-11 MED ORDER — VALACYCLOVIR HCL 1 G PO TABS: 2000.0000 mg | ORAL_TABLET | Freq: Two times a day (BID) | ORAL | 0 refills | Status: DC

## 2024-02-11 MED ORDER — OMEPRAZOLE 40 MG PO CPDR: 40.0000 mg | DELAYED_RELEASE_CAPSULE | Freq: Every day | ORAL | 1 refills | Status: AC

## 2024-02-11 MED ORDER — ONDANSETRON 4 MG PO TBDP
4.0000 mg | ORAL_TABLET | Freq: Once | ORAL | Status: AC
  Administered 2024-02-11: 4 mg via ORAL
  Filled 2024-02-11: qty 1

## 2024-02-11 MED ORDER — OXYCODONE-ACETAMINOPHEN 5-325 MG PO TABS
1.0000 | ORAL_TABLET | Freq: Once | ORAL | Status: AC
  Administered 2024-02-11: 1 via ORAL
  Filled 2024-02-11: qty 1

## 2024-02-11 MED ORDER — PHENYLEPHRINE-MINERAL OIL-PET 0.25-14-74.9 % RE OINT: 1.0000 | TOPICAL_OINTMENT | Freq: Two times a day (BID) | RECTAL | 1 refills | Status: DC | PRN

## 2024-02-11 MED ORDER — OXYCODONE HCL 5 MG PO TABS: 5.0000 mg | ORAL_TABLET | Freq: Three times a day (TID) | ORAL | 0 refills | Status: DC | PRN

## 2024-02-11 MED ORDER — POLYETHYLENE GLYCOL 3350 17 G PO PACK: 17.0000 g | PACK | Freq: Every day | ORAL | 0 refills | Status: DC

## 2024-02-11 MED ORDER — ONDANSETRON 4 MG PO TBDP: 4.0000 mg | ORAL_TABLET | Freq: Four times a day (QID) | ORAL | 0 refills | Status: AC | PRN

## 2024-02-11 MED ORDER — HYDROCORTISONE ACETATE 25 MG RE SUPP: 25.0000 mg | Freq: Two times a day (BID) | RECTAL | 1 refills | Status: AC

## 2024-02-11 MED ORDER — LIDOCAINE 5 % EX OINT: 1.0000 | TOPICAL_OINTMENT | CUTANEOUS | 0 refills | Status: DC | PRN

## 2024-02-11 NOTE — Telephone Encounter (Signed)
 Agree with evaluation  He needs to have an office visit to discuss antivirals and for hospital follow up

## 2024-02-11 NOTE — Telephone Encounter (Signed)
 Pt is scheduled for antiviral/hospital follow up for 02/13/24

## 2024-02-11 NOTE — Patient Instructions (Addendum)
 VISIT SUMMARY:  During your visit, we discussed your rectal pain and bleeding, throat pain, and other symptoms. We reviewed your current medications and made some adjustments to help manage your symptoms more effectively. We also addressed your anxiety and provided recommendations for further evaluation and management.  YOUR PLAN:  -ANAL FISSURE AND EXTERNAL HEMORRHOID: You have a large external hemorrhoid and an anal fissure, which are causing significant rectal pain and bleeding. An anal fissure is a small tear in the lining of the anus. We recommend using the suppository as prescribed, increasing your water intake to at least four bottles daily, and performing Sitz baths with warm water several times daily. I also sent nifedipine  ointment to your pharmacy for you to use on the fissure. We have referred you to gastroenterology and general surgery for further evaluation and management.  -GASTROESOPHAGEAL REFLUX DISEASE WITH ODYNOPHAGIA: You have GERD, which is causing throat pain and difficulty swallowing. GERD is a condition where stomach acid frequently flows back into the tube connecting your mouth and stomach. We recommend starting Zofran  to manage nausea and improve medication adherence, and ensuring consistent use of omeprazole . We have referred you to gastroenterology for further evaluation.  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 Herpes simplex virus is a common viral infection that causes sores on the mouth or genitals. We have prescribed antiviral medication to help manage this condition.  -GENERALIZED ANXIETY DISORDER: You have generalized anxiety disorder, which is being exacerbated by your health issues and work-related stress. Generalized anxiety disorder is a condition characterized by persistent and excessive worry about various aspects of life. We recommend starting Zofran  to manage nausea  associated with Buspar  and scheduling a follow-up with your primary care provider for stress management and anxiety treatment.

## 2024-02-11 NOTE — Telephone Encounter (Signed)
 Pt is very upset about the care he has received from the ER. Pt states that he was seen in the ER last night around 930pm, dc about 3am. He states doctor did a rectal exam with minimal to no lube forcefully. He went there for multiple symptoms including rectal pain and bleeding, including blood clots with and without stool, anal tightness and spasms, anal fissure, hemorrhoid- inflamed and had pus on it, sore throat- feels like he is swallowing sand paper, constipation due to fear of causing more pain, swollen lymph nodes in neck, groin is tender, HSV blisters/redness swelling. He states he was misdiagnosed with a hemorrhoid and she gave him some cream that is only supposed to be used externally AND on closed skin. The medication has caused more pain due the anal fissures. He is positive for HSV and they did not prescribe treatment, he w[facility] to [facility] and they did. Er did prescribe pain meds oxy ir 5mg  every 8 hours. He states it is not touching his pain at all. He is requesting something stronger for pain and also some medication that will paralyze or numb his anal sphincter so he can have some relief. He states he was able to have a bm after the medication was used (gel on rectum).  Pt is still experiencing significant pain and all the other symptoms above. RN did advise pt message would be sent to the office, but unfortunately it is after hours and no answer on medications tonight (he also asked if he could take more than prescribed). RN advised can't make that call, it has to come from a provider. He states he was going to take another one to see if it helped (last dose was about 1pm). RN did look to see if there were any other openings in the office for tomorrow (1.15.26) but there are not. RN did advise if he does not get relief from medication and continues to have symptoms like he is having, would recommend he go back to an ER. RN stated understanding for frustration but that would be where he needs to  go to get assistance if pain is unbearable. He stated understanding. RN spent approximately 30 minutes with patient.

## 2024-02-11 NOTE — Discharge Instructions (Addendum)
 You may alternate over the counter Tylenol  1000 mg every 6 hours as needed for pain, fever and Ibuprofen  800 mg every 6-8 hours as needed for pain, fever.  Please take Ibuprofen  with food.  Do not take more than 4000 mg of Tylenol  (acetaminophen ) in a 24 hour period.  You are being provided a prescription for opiates (also known as narcotics) for pain control.  Opiates can be addictive and should only be used when absolutely necessary for pain control when other alternatives do not work.  We recommend you only use them for the recommended amount of time and only as prescribed.  Please do not take with other sedative medications or alcohol.  Please do not drive, operate machinery, make important decisions while taking opiates.  Please note that these medications can be addictive and have high abuse potential.  Patients can become addicted to narcotics after only taking them for a few days.  Please keep these medications locked away from children, teenagers or any family members with history of substance abuse.  Narcotic pain medicine may also make you constipated.  You may use over-the-counter medications such as MiraLAX , Colace to prevent constipation.  If you become constipated, you may use over-the-counter enemas as needed.  Itching and nausea are also common side effects of narcotic pain medication.  If you develop uncontrolled vomiting or a rash, please stop these medications and seek medical care.  I recommend eating a high-fiber diet, increasing your water intake and increasing your omeprazole  to 40 mg daily.  Please follow-up with GI as an outpatient as you may need an endoscopy to further diagnose your discomfort with swallowing.  There are no clinical signs of thrush today.   Please avoid NSAIDs such as aspirin (Goody powders), ibuprofen  (Motrin , Advil ), naproxen (Aleve) as these may worsen your symptoms.  Tylenol  1000 mg every 6 hours is safe to take as long as you have no history of liver problems  (heavy alcohol use, cirrhosis, hepatitis).  Please avoid spicy, acidic (citrus fruits, tomato based sauces, salsa), greasy, fatty foods.  Please avoid caffeine  and alcohol.  Smoking can also make GERD/acid reflux worse.  Over the counter medications such as TUMS, Maalox or Mylanta, pepcid, Prilosec or Nexium may help with your symptoms.  Do not take Prilosec or Nexium if you are already prescribed a proton pump inhibitor.   Also recommend taking medications to keep your stool soft such as MiraLAX  and Colace while taking pain medication as constipation can worsen pain from anal fissures and hemorrhoids.   Please also follow-up with general surgery to discuss possible hemorrhoidectomy if continuing to have pain from your external hemorrhoid.   For your hemorrhoids, I recommend that you use a stool softener such as Colace 100 mg twice a day to keep your bowel movements soft.  I also recommend that you do not use toilet paper and instead use witch hazel wipes or baby wipes. A high-fiber diet will also help to keep your stools soft as well increasing your water intake. If you cannot eat foods high in fiber, you may use Benefiber or Metamucil over-the-counter once daily. This will help prevent constipation and keep stools soft as to not irritate your hemorrhoids more.  I also recommend over the counter preparation H for your hemorrhoids as well to help with inflammation.  Narcotic pain medication is often not recommended for pain from hemorrhoids as these medications slow gut motility worsening constipation and can therefore worsen rectal pain, bleeding and inflammation from hemorrhoids.

## 2024-02-11 NOTE — Telephone Encounter (Signed)
 FYI Only or Action Required?: FYI only for provider: ED advised.  Patient was last seen in primary care on 02/11/2024 by Mayers, Kirk RAMAN, PA-C.  Called Nurse Triage reporting Rectal Pain.  Symptoms began several days ago.  Interventions attempted: Prescription medications: Oxycodone , lidocaine  ointment.  Symptoms are: unchanged.  Triage Disposition: Call PCP Now  Patient/caregiver understands and will follow disposition?: Yes  Reason for Disposition  [1] SEVERE pain (e.g., excruciating, pain scale 8-10) AND [2] not improved after pain medications  Answer Assessment - Initial Assessment Questions Patient states that he went to the ED last night for these symptoms and they diagnosed it as external hemorrhoid. He was provided 5mg  oxycodone  for pain and states that it's not helping the pain at all. Pain is still 8-9/10. ED advised.   1. MAIN CONCERN OR SYMPTOM:  What is your main concern right now? What question do you have? What's the main symptom you're worried about? (e.g., breathing difficulty, cough, fever, pain)     Pain  2. ONSET: When did the  pain  start?     Monday  3. BETTER-SAME-WORSE: Are you getting better, staying the same, or getting worse compared to how you felt at your last visit to the doctor (most recent medical visit)?     Same  4. VISIT DATE: When were you seen? (e.g., date)     [facility] ED  5. VISIT DOCTOR: What is the name of the doctor taking care of you now?     Kristen Ward  6. VISIT DIAGNOSIS:  What was the main symptom or problem that you were seen for? Were you given a diagnosis?      External hemorrhoid, rectal pain  7. VISIT MEDICINES: Did the doctor order any new medicines for you to use? If Yes, ask: Have you filled the prescription and started taking the medicine?      Docusate sodium , Oxycodone  5mg , Lidocaine  ointment  8. NEXT APPOINTMENT: Have you scheduled a follow-up appointment with your  doctor?     02/13/24   9. PAIN: Is there any pain? If Yes, ask: How bad is it?  (Scale 0-10; or none, mild, moderate, severe)     8/10  10. FEVER: Do you have a fever? If Yes, ask: What is it, how was it measured  and when did it start?       No  11. OTHER SYMPTOMS: Do you have any other symptoms?       Rectal bleeding and purulent drainage  12. PREGNANCY: Is there any chance you are pregnant? When was your last menstrual period?       NA  Protocols used: Recent Medical Visit for Illness Follow-up Call-A-AH  Copied from CRM #[id number]. Topic: Clinical - Red Word Triage >> Feb 11, 2024  5:35 PM Drema MATSU wrote: Red Word that prompted transfer to Nurse Triage: Patient stated that the pain medication given by the ED is not working.  Pt wants to confirm if he needs to go to the ED.

## 2024-02-11 NOTE — Telephone Encounter (Signed)
 FYI Only or Action Required?: Action required by provider: update on patient condition.  Patient was last seen in primary care on 08/28/2023 by Wendee Lynwood HERO, NP.  Called Nurse Triage reporting SEXUALLY TRANSMITTED DISEASE.  Symptoms began a week ago.  Interventions attempted: Other: ED visit.  Symptoms are: unchanged.  Triage Disposition: See HCP Within 4 Hours (Or PCP Triage)  Patient/caregiver understands and will follow disposition?: Yes                Copied from CRM [id number]. Topic: Clinical - Red Word Triage >> Feb 11, 2024 10:03 AM Nessti S wrote: [facility] that prompted transfer to Nurse Triage: pain with herpes outbreak (cold sores)   ----------------------------------------------------------------------- From previous Reason for Contact - Scheduling: Patient/patient representative is calling to schedule an appointment. Refer to attachments for appointment information. Reason for Disposition  SEVERE rectal pain (e.g., excruciating, unable to have a bowel movement)  Answer Assessment - Initial Assessment Questions Patient went to ED yesterday. ED did not write any antivirals prescriptions or look at his throat but provided him with general surgery referral for hemorrhoid. At the end of triage, patient mentions over the past few hours he has started to feel lightheaded/dizzy. Recommended ED, urgent care or mobile medicine clinic. Patient agreeable to mobile medicine clinic.  1. SYMPTOM:  What's the main symptom you're concerned about? (e.g., pain, itching, swelling, rash)     Rectal pain, bleeding and rectal hemorrhoid seen in ED yesterday. Pus like drainage, foul odor and red blistered rash.  2. ONSET: When did the rash  start?     1 week ago.  3. RECTAL PAIN: Do you have any pain around your rectum? How bad is the pain?  (Scale 0-10; or none, mild, moderate, severe)     Yes, severe.  4. RECTAL ITCHING: Do you have any itching in this area? How  bad is the itching?  (Scale 0-10; or none, mild, moderate, severe)     Kind of  5. CONSTIPATION: Do you have constipation? If Yes, ask: How often do you have a bowel movement (BM)?  (Normal range: 3 times a day to every 3 days)  When was your last BM?       Yes. 2-3 BM daily.  6. CAUSE: What do you think is causing the anus symptoms?     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 He states this is his first outbreak. Mouth, lips, throat, rectum/buttocks.  7. OTHER SYMPTOMS: Do you have any other symptoms?  (e.g., abdomen pain, fever, rectal bleeding, vomiting)     Mouth feels like sandpaper, and feels food stuck back there. Clusters of white filled blisters at back of throat. No fever. Bloody, pus, foul odor discharge from rectum noticed while wearing a sanitary pad.  Protocols used: Rectal Symptoms-A-AH

## 2024-02-11 NOTE — Progress Notes (Signed)
 "  New Patient Office Visit  Subjective    Patient ID: Jesus Mason, male    DOB: 2000/07/11  Age: 24 y.o. MRN: [id number]  CC:  Chief Complaint  Patient presents with   Hemorrhoids   Discussed the use of AI scribe software for clinical note transcription with the patient, who gave verbal consent to proceed.   History of Present Illness   Jesus Mason is a 24 year old male who presents with rectal pain and bleeding.  He reports severe rectal pain and bleeding that are worse than prior hemorrhoid flares. Desitin, lidocaine  ointment, and recently prescribed Anusol  suppositories have given limited relief.  He has GERD and for about a week has had throat pain with clustered dots on his throat and spots on the roof of his mouth, along with painful swallowing. He was prescribed omeprazole  but often develops nausea and vomiting when taking his medications together and has trouble keeping them down. He notes unintentional weight loss from 165 lb in July to about 138-139 lb despite trying to eat.  He tested positive for herpes in November and now has blisters on his lips and possibly in his mouth, though they are not painful, as well as new pimples and dots on his body.  He has anxiety and believes stress may worsen his symptoms. He takes Buspar  but has been inconsistent due to nausea. He also has ADD.  He reports tender lymph nodes in the groin and neck stiffness, with lymph node tenderness on pressure but no fever.    Outpatient Encounter Medications as of 02/11/2024  Medication Sig   docusate sodium  (COLACE) 100 MG capsule Take 1 capsule (100 mg total) by mouth 2 (two) times daily.   fluticasone  (FLONASE ) 50 MCG/ACT nasal spray Place 2 sprays into both nostrils daily.   lidocaine  (XYLOCAINE ) 5 % ointment Apply 1 Application topically as needed.   meclizine  (ANTIVERT ) 12.5 MG tablet Take 1 tablet (12.5 mg total) by mouth 3 (three) times daily as needed for  dizziness.   nifedipine  0.3 % ointment Place 1 Application rectally 4 (four) times daily.   omeprazole  (PRILOSEC) 40 MG capsule Take 1 capsule (40 mg total) by mouth daily.   ondansetron  (ZOFRAN -ODT) 4 MG disintegrating tablet Take 1 tablet (4 mg total) by mouth every 6 (six) hours as needed for nausea or vomiting.   polyethylene glycol (MIRALAX ) 17 g packet Take 17 g by mouth daily.   valACYclovir  (VALTREX ) 1000 MG tablet Take 2 tablets (2,000 mg total) by mouth 2 (two) times daily for 1 day.   busPIRone  (BUSPAR ) 5 MG tablet Take 1 tablet (5 mg total) by mouth 3 (three) times daily.   DESCOVY 200-25 MG tablet Take 1 tablet by mouth daily.   dibucaine (NUPERCAINAL) 1 % OINT Place 1 Application rectally 3 (three) times daily as needed.   doxycycline  (VIBRA -TABS) 100 MG tablet Take by mouth.   hydrocortisone  (ANUSOL -HC) 25 MG suppository Place 1 suppository (25 mg total) rectally every 12 (twelve) hours.   oxyCODONE  (ROXICODONE ) 5 MG immediate release tablet Take 1 tablet (5 mg total) by mouth every 8 (eight) hours as needed. (Patient not taking: Reported on 02/11/2024)   phenylephrine -shark liver oil-mineral oil-petrolatum (PREPARATION H) 0.25-14-74.9 % rectal ointment Place 1 Application rectally 2 (two) times daily as needed for hemorrhoids.   predniSONE  (DELTASONE ) 20 MG tablet 2 tabs po daily for 5 days, then 1 tab po daily for 5 days   senna-docusate (SENOKOT-S) 8.6-50 MG tablet Take  1 tablet by mouth daily. (Patient not taking: Reported on 02/11/2024)   tamsulosin  (FLOMAX ) 0.4 MG CAPS capsule Take 1 capsule (0.4 mg total) by mouth daily. (Patient not taking: Reported on 02/11/2024)   No facility-administered encounter medications on file as of 02/11/2024.    Past Medical History:  Diagnosis Date   ADHD (attention deficit hyperactivity disorder)    Anxiety    Kidney stones    Renal disorder    kidney stones    Past Surgical History:  Procedure Laterality Date   ADENOIDECTOMY      TONSILLECTOMY     TYMPANOSTOMY TUBE PLACEMENT      Family History  Problem Relation Age of Onset   Breast cancer Maternal Grandmother    Diabetes Paternal Grandmother    Diabetes Paternal Grandfather     Social History   Socioeconomic History   Marital status: Single    Spouse name: Not on file   Number of children: 0   Years of education: Not on file   Highest education level: Not on file  Occupational History   Not on file  Tobacco Use   Smoking status: Every Day    Types: Cigars, Cigarettes   Smokeless tobacco: Never   Tobacco comments:    Occasional cigar.   Vaping Use   Vaping status: Every Day   Substances: Nicotine, Flavoring  Substance and Sexual Activity   Alcohol use: Yes    Comment: occasional   Drug use: Yes    Types: Marijuana   Sexual activity: Not on file  Other Topics Concern   Not on file  Social History Narrative   Fulltime: [employer]      Hobbies: computers    Social Drivers of Health   Tobacco Use: High Risk (02/11/2024)   Patient History    Smoking Tobacco Use: Every Day    Smokeless Tobacco Use: Never    Passive Exposure: Not on file  Financial Resource Strain: Not on file  Food Insecurity: Patient Declined (02/11/2024)   Epic    Worried About Programme Researcher, Broadcasting/film/video in the Last Year: Patient declined    Barista in the Last Year: Patient declined  Transportation Needs: Unmet Transportation Needs (02/11/2024)   Epic    Lack of Transportation (Medical): Yes    Lack of Transportation (Non-Medical): Yes  Physical Activity: Not on file  Stress: Not on file  Social Connections: Not on file  Intimate Partner Violence: Not At Risk (02/11/2024)   Epic    Fear of Current or Ex-Partner: No    Emotionally Abused: No    Physically Abused: No    Sexually Abused: No  Depression (PHQ2-9): High Risk (02/11/2024)   Depression (PHQ2-9)    PHQ-2 Score: 17  Alcohol Screen: Not on file  Housing: Patient Declined (02/11/2024)   Epic    Unable  to Pay for Housing in the Last Year: Patient declined    Number of Times Moved in the Last Year: Not on file    Homeless in the Last Year: Patient declined  Utilities: Not At Risk (02/11/2024)   Epic    Threatened with loss of utilities: No  Health Literacy: Not on file    Review of Systems  Constitutional:  Negative for chills and fever.  HENT: Negative.    Eyes: Negative.   Respiratory:  Negative for shortness of breath.   Cardiovascular:  Negative for chest pain.  Gastrointestinal:  Positive for nausea and vomiting.  Genitourinary:  Negative.   Musculoskeletal: Negative.   Skin: Negative.   Neurological: Negative.   Endo/Heme/Allergies: Negative.   Psychiatric/Behavioral: Negative.          Objective    BP (!) 147/102 (BP Location: Left Arm, Patient Position: Sitting)   Pulse (!) 114   SpO2 98%   Physical Exam Vitals and nursing note reviewed.  Constitutional:      General: He is not in acute distress.    Appearance: Normal appearance.  HENT:     Head: Normocephalic and atraumatic.     Right Ear: External ear normal.     Left Ear: External ear normal.     Nose: Nose normal.     Mouth/Throat:     Mouth: Mucous membranes are moist.     Pharynx: Oropharynx is clear.  Cardiovascular:     Rate and Rhythm: Normal rate and regular rhythm.     Pulses: Normal pulses.  Pulmonary:     Effort: Pulmonary effort is normal.     Breath sounds: Normal breath sounds.  Genitourinary:    Comments: Not examined, patient was just seen in ED , also had many photos on his phone for me to view.  Large erythematic external hemorrhoid and small anal fissure noted. Musculoskeletal:        General: Normal range of motion.     Cervical back: Normal range of motion and neck supple.  Skin:    General: Skin is warm and dry.     Comments: Small blister on lower lip, right side noted   Neurological:     General: No focal deficit present.     Mental Status: He is alert and oriented to  person, place, and time.  Psychiatric:        Mood and Affect: Mood normal.        Thought Content: Thought content normal.        Judgment: Judgment normal.         Assessment & Plan:   Problem List Items Addressed This Visit       Cardiovascular and Mediastinum   Hemorrhoids - Primary   Relevant Orders   Ambulatory referral to General Surgery   Ambulatory referral to Gastroenterology     Other   GAD (generalized anxiety disorder)   Other Visit Diagnoses       Anal fissure       Relevant Medications   nifedipine  0.3 % ointment   Other Relevant Orders   Ambulatory referral to General Surgery   Ambulatory referral to Gastroenterology     Cold sore       Relevant Medications   valACYclovir  (VALTREX ) 1000 MG tablet     Gastroesophageal reflux disease without esophagitis       Relevant Orders   Ambulatory referral to Gastroenterology      Assessment and Plan Anal fissure and external hemorrhoid Large nonthrombosed external hemorrhoid with anal fissure causing significant rectal pain and bleeding. Previous treatments provided limited relief. Urgent referral needed for further evaluation and management. Continue regimen as prescribed by emergency department.  Trial nifedipine  ointment.  Patient education given on supportive care.  Red flags given for prompt reevaluation.  Urgent referrals placed for general surgery and gastroenterology as per referred in after visit summary from emergency department.  Gastroesophageal reflux disease with odynophagia GERD with odynophagia, presenting with throat pain and difficulty swallowing. Omeprazole  adherence inconsistent due to nausea. Referral needed for further evaluation and management. - Start Zofran  to manage nausea and  improve medication adherence, as prescribed emergency department - Ensure consistent use of omeprazole . - Referred to gastroenterology.  Oral herpes simplex virus infection Positive herpes simplex virus test  with painful oral blisters. No recent antiviral treatment due to lack of access to primary care. Trial Valtrex , follow-up with primary care provider for further management  Generalized anxiety disorder GAD with stressors including health issues and work-related stress. Buspar  not taken due to nausea.  - Schedule follow-up with primary care provider for stress management and anxiety treatment.   I have reviewed the patient's medical history (PMH, PSH, Social History, Family History, Medications, and allergies) , and have been updated if relevant. I spent 40 minutes reviewing chart and  face to face time with patient.    Return if symptoms worsen or fail to improve.   Kirk RAMAN Mayers, PA-C   "

## 2024-02-11 NOTE — Telephone Encounter (Signed)
 FYI Only or Action Required?: FYI only for provider: appointment scheduled on 02/13/24.  Patient was last seen in primary care on 02/11/2024 by Jesus Mason, Jesus RAMAN, PA-C.  Called Nurse Triage reporting Rectal Pain.  Symptoms began several days ago.  Interventions attempted: Prescription medications: oxy 5mg ; Zofran ; Valtrex .  Symptoms are: gradually worsening.  Triage Disposition: See Physician Within 24 Hours  Patient/caregiver understands and will follow disposition?: Yes    Copied from CRM [id number]. Topic: Clinical - Red Word Triage >> Feb 11, 2024 12:57 PM Robinson H wrote: Patient calling back states he was advised to go to mobile bus for testing and states mobile bus doesn't have proper tools for testing and wants to know should he go to ER.    Reason for Disposition  MODERATE-SEVERE rectal pain (i.e., interferes with school, work, or sleep)  Answer Assessment - Initial Assessment Questions Pt called to report [facility] provider referred pt back to PCP as she was able to do exam and prescribe valtrex , referral to gen surgery and gastro for anal fissure. Pt states he also has lesion on the back of his throat that is new that she was unable to test. Pt has never had any oral or throat lesions. Pt is currently have rectal pain with bleeding and itching. Examined in ED and pt reports significant discomfort as provider did not use lubrication. Pt states after exam, he has had worsened pain and now is experiencing HSV flare d/t stress. Pt reports applying vaseline to anus/rectal area prior to BM to ease pain. Pt is taking colace and miralax . Pt prescribed zofran  for n/v and oxy 5mg  for pain. Pt questioned how oxy will help him as he took one an oxy from his partner and it did not relieve pain. Pt states he was able to rest and get some sleep but pain still present. Encouraged him not to take any medication other than his own and caution with oxy as it can cause constipation. Pt voiced  understanding. Per my chart, pt was to schedule with PCP for antivirals. No appts with PCP but pt agreeable to see alternative provider. Pt scheduled at soonest available 01/16 and placed on wait list. Pt requested call back if PCP has any cancellations or can see him sooner. Pt also stated if PCP wants him to go back to ED, he will but has been leery d/t previous visit this morning.     1. SYMPTOM:  What's the main symptom you're concerned about? (e.g., pain, itching, swelling, rash)     Itching, rectal pain with bleeding   2. ONSET: When did the symptoms  start?     72h ago   3. RECTAL PAIN: Do you have any pain around your rectum? How bad is the pain?  (Scale 0-10; or none, mild, moderate, severe)     Yes; moderate-severe   4. RECTAL ITCHING: Do you have any itching in this area? How bad is the itching?  (Scale 0-10; or none, mild, moderate, severe)     No   5. CONSTIPATION: Do you have constipation? If Yes, ask: How often do you have a bowel movement (BM)?  (Normal range: 3 times a day to every 3 days)  When was your last BM?       Pt takes miralax  and prescribed colace   6. CAUSE: What do you think is causing the anus symptoms?     Pt suspects hemorroids vs. Anal fissure vs. HSV breakout   7. OTHER SYMPTOMS: Do  you have any other symptoms?  (e.g., abdomen pain, fever, rectal bleeding, vomiting)     No fever, no abd pain  Protocols used: Rectal Symptoms-A-AH

## 2024-02-11 NOTE — Telephone Encounter (Signed)
 Duplicate; pt spoke to alternative NT. See notes.     Copied from CRM [id number]. Topic: Clinical - Red Word Triage >> Feb 11, 2024  4:45 PM Winona R wrote: Pt is calling back again to find out what he can do for the rectal pain as he is taking oxyCODONE  but it does not seem to be helping at all if he was at a level 10 pain before the meds he would say he's still at a level 10 now. . Pt would like to know how much of the oxyCODONE  (ROXICODONE ) 5 MG immediate release tablet [[id number]] he can He took one at 1:40 pm and it has not help.  Pt wants to know if he can take another or if he should go back to the ER.

## 2024-02-11 NOTE — ED Triage Notes (Addendum)
 Pt was seen here for rectal pain, pt reports he was given rx for oxycodone  and it is not helping. Pt reports I used to take 600mg  oxycodone 

## 2024-02-11 NOTE — ED Provider Notes (Signed)
 "  [facility] Provider Note    Event Date/Time   First MD Initiated Contact with Patient 02/11/24 0033     (approximate)   History   Rectal Pain   HPI  Jesus Mason is a 24 y.o. male with history of ADHD, anxiety, kidney stones who presents to the emergency department with complaints of intermittent episodes of rectal pain, rectal bleeding.  Has been using Desitin and over-the-counter Preparation H with some relief.  Denies any purulent drainage.  No fever.  States pain with sitting.  Pain uncontrolled at home when he is not able to sleep.  Also complains of intermittent abdominal pain.  No vomiting.  Also complaining of discomfort with swallowing.  This has been ongoing for some time as well.  He does take omeprazole  20 mg daily for acid reflux.  He has never had endoscopy or colonoscopy.  Does not have a gastroenterologist.  No history of HIV, thrush.  Patient has a list of symptoms on his phone for me to review.   History provided by patient.    Past Medical History:  Diagnosis Date   ADHD (attention deficit hyperactivity disorder)    Anxiety    Kidney stones    Renal disorder    kidney stones    Past Surgical History:  Procedure Laterality Date   ADENOIDECTOMY     TONSILLECTOMY     TYMPANOSTOMY TUBE PLACEMENT      MEDICATIONS:  Prior to Admission medications  Medication Sig Start Date End Date Taking? Authorizing Provider  busPIRone  (BUSPAR ) 5 MG tablet Take 1 tablet (5 mg total) by mouth 3 (three) times daily. 11/25/22   Wendee Lynwood HERO, NP  DESCOVY 200-25 MG tablet Take 1 tablet by mouth daily. 04/04/23   [provider]  dibucaine (NUPERCAINAL) 1 % OINT Place 1 Application rectally 3 (three) times daily as needed. 08/08/23   Menshew, Candida LULLA Kings, PA-C  doxycycline  (VIBRA -TABS) 100 MG tablet Take by mouth. 04/03/23   [provider]  fluticasone  (FLONASE ) 50 MCG/ACT nasal spray Place 2 sprays into both nostrils  daily. 05/09/23   Wendee Lynwood HERO, NP  hydrocortisone  (ANUSOL -HC) 25 MG suppository Place 1 suppository (25 mg total) rectally every 12 (twelve) hours. 08/08/23   Menshew, Candida LULLA Kings, PA-C  meclizine  (ANTIVERT ) 12.5 MG tablet Take 1 tablet (12.5 mg total) by mouth 3 (three) times daily as needed for dizziness. 08/07/23   Wendee Lynwood HERO, NP  omeprazole  (PRILOSEC) 20 MG capsule Take 1 capsule (20 mg total) by mouth daily. 05/09/23   Wendee Lynwood HERO, NP  ondansetron  (ZOFRAN -ODT) 4 MG disintegrating tablet Take 1 tablet (4 mg total) by mouth every 8 (eight) hours as needed for nausea or vomiting. 08/28/23   Wendee Lynwood HERO, NP  predniSONE  (DELTASONE ) 20 MG tablet 2 tabs po daily for 5 days, then 1 tab po daily for 5 days 06/12/23   Copland, Jacques, MD  senna-docusate (SENOKOT-S) 8.6-50 MG tablet Take 1 tablet by mouth daily. 08/14/23   Malvina Alm DASEN, MD  tamsulosin  (FLOMAX ) 0.4 MG CAPS capsule Take 1 capsule (0.4 mg total) by mouth daily. 02/19/22   Dorothyann Drivers, MD    Physical Exam   Triage Vital Signs: ED Triage Vitals  Encounter Vitals Group     BP 02/10/24 2106 (!) 146/91     Girls Systolic BP Percentile --      Girls Diastolic BP Percentile --      Boys Systolic BP Percentile --  Boys Diastolic BP Percentile --      Pulse Rate 02/10/24 2106 (!) 110     Resp 02/10/24 2106 17     Temp 02/10/24 2106 98 F (36.7 C)     Temp src --      SpO2 02/10/24 2112 100 %     Weight 02/10/24 2106 139 lb 12.4 oz (63.4 kg)     Height 02/10/24 2106 5' 6 (1.676 m)     Head Circumference --      Peak Flow --      Pain Score 02/10/24 2111 6     Pain Loc --      Pain Education --      Exclude from Growth Chart --     Most recent vital signs: Vitals:   02/10/24 2106 02/10/24 2112  BP: (!) 146/91   Pulse: (!) 110   Resp: 17   Temp: 98 F (36.7 C)   SpO2:  100%    CONSTITUTIONAL: Alert, responds appropriately to questions. Well-appearing; well-nourished HEAD: Normocephalic,  atraumatic EYES: Conjunctivae clear, pupils appear equal, sclera nonicteric ENT: normal nose; moist mucous membranes; No pharyngeal erythema or petechiae, no tonsillar hypertrophy or exudate, no uvular deviation, no unilateral swelling in posterior oropharynx, no trismus or drooling, no muffled voice, normal phonation, no stridor, airway patent.  No thrush. NECK: Supple, normal ROM CARD: Regular and minimally tachycardic; S1 and S2 appreciated RESP: Normal chest excursion without splinting or tachypnea; breath sounds clear and equal bilaterally; no wheezes, no rhonchi, no rales, no hypoxia or respiratory distress, speaking full sentences ABD/GI: Non-distended; soft, non-tender, no rebound, no guarding, no peritoneal signs, no tenderness at McBurney's point RECTAL:  Normal rectal tone, no gross blood or melena, guaiac, patient tender on rectal exam but no prostate enlargement appreciated or fluctuance within the rectum.  He does have a large nonthrombosed external hemorrhoid on exam that is tender to palpation but no abscess.  No fecal impaction. Chaperone present Bonney Berry, EDT) EXT: Normal ROM in all joints; no deformity noted, no edema SKIN: Normal color for age and race; warm; no rash on exposed skin NEURO: Moves all extremities equally, normal speech PSYCH: The patient's mood and manner are appropriate.   ED Results / Procedures / Treatments   LABS: (all labs ordered are listed, but only abnormal results are displayed) Labs Reviewed - No data to display   EKG:  EKG Interpretation Date/Time:    Ventricular Rate:    PR Interval:    QRS Duration:    QT Interval:    QTC Calculation:   R Axis:      Text Interpretation:           RADIOLOGY: My personal review and interpretation of imaging:    I have personally reviewed all radiology reports.   No results found.   PROCEDURES:  Critical Care performed: No     Procedures    IMPRESSION / MDM / ASSESSMENT AND  PLAN / ED COURSE  I reviewed the triage vital signs and the nursing notes.    Patient here with multiple complaints.  Complaining of rectal pain, abdominal pain, discomfort with swallowing.     DIFFERENTIAL DIAGNOSIS (includes but not limited to):   Hemorrhoid, anal fissure, no sign of rectal abscess, no sign of Fournier's gangrene or necrotizing fasciitis  Abdominal exam here benign.  Doubt appendicitis, colitis, diverticulitis, perforation, cholecystitis, pancreatitis, UTI, kidney stone, pyelonephritis.  Pain with swallowing may be secondary to acid reflux.  No signs  of thrush.  No sign of tonsillitis, pharyngitis, deep space neck infection, uvulitis, PTA.  Doubt malignancy, food bolus.   Patient's presentation is most consistent with acute complicated illness / injury requiring diagnostic workup.   PLAN: Patient here with complaints of rectal pain.  Has external hemorrhoid on exam.  He is requesting something for pain.  We did discuss at length that narcotic pain medication can cause constipation which would worsen pain from hemorrhoids and potential anal fissure.  He verbalized understanding but would still like something stronger than over-the-counter medications.  Will discharge with short course of oxycodone  but start him on MiraLAX  and Colace as well to keep stool soft.  Recommended increase water intake, increase fiber intake.  Will discharge with prescription for Preparation H to use topically as well as topical lidocaine  and Anusol  suppositories.  Have given him general surgery follow-up information as patient may need hemorrhoidectomy given this appears to be a chronic intermittent issue for him.  There are no signs of any vesicular lesions, warts on exam today and nothing that appears to suggest cellulitis or abscess.  No signs of necrotizing fasciitis or gangrene.   As for his abdominal pain, exam is very benign.  He denies any fevers, vomiting, urinary symptoms.  I do not feel  there is any indication for abdominal imaging at this time.  We did discuss that constipation could be causing symptoms especially because he is worried about having a bowel movement due to pain and he agrees.  I think that over-the-counter medications will help with this significantly.  No indication for emergent imaging of his abdomen.   He is also concerned about having odynophagia.  There is no sign of thrush on exam and he has had a recent negative HIV test.  I suspect this may be related to acid reflux and have recommended he increase his omeprazole  to 40 mg daily but will refer him to gastroenterology as he may need an endoscopy.  Recommended diet changes.  He is managing his secretions here without difficulty.  There is no sign of any pharyngitis, tonsillitis, uvulitis, deep space neck infection, PTA.  He appears well-hydrated on exam.  He is able to tolerate p.o. here.   He has a PCP for follow-up.  Encouraged that he make an appointment to manage him of these chronic symptoms.   MEDICATIONS GIVEN IN ED: Medications  oxyCODONE -acetaminophen  (PERCOCET/ROXICET) 5-325 MG per tablet 1 tablet (1 tablet Oral Given 02/11/24 0109)  ondansetron  (ZOFRAN -ODT) disintegrating tablet 4 mg (4 mg Oral Given 02/11/24 0109)  lidocaine -prilocaine  (EMLA ) cream ( Topical Given 02/11/24 0109)     ED COURSE:  At this time, I do not feel there is any life-threatening condition present. I reviewed all nursing notes, vitals, pertinent previous records.  All lab and urine results, EKGs, imaging ordered have been independently reviewed and interpreted by myself.  I reviewed all available radiology reports from any imaging ordered this visit.  Based on my assessment, I feel the patient is safe to be discharged home without further emergent workup and can continue workup as an outpatient as needed. Discussed all findings, treatment plan as well as usual and customary return precautions.  They verbalize understanding and  are comfortable with this plan.  Outpatient follow-up has been provided as needed.  All questions have been answered.    CONSULTS:  none   OUTSIDE RECORDS REVIEWED: Reviewed previous family medicine notes.       FINAL CLINICAL IMPRESSION(S) / ED DIAGNOSES  Final diagnoses:  External hemorrhoid  Odynophagia     Rx / DC Orders   ED Discharge Orders          Ordered    hydrocortisone  (ANUSOL -HC) 25 MG suppository  Every 12 hours        02/11/24 0101    docusate sodium  (COLACE) 100 MG capsule  2 times daily        02/11/24 0101    polyethylene glycol (MIRALAX ) 17 g packet  Daily        02/11/24 0101    omeprazole  (PRILOSEC) 40 MG capsule  Daily        02/11/24 0101    ondansetron  (ZOFRAN -ODT) 4 MG disintegrating tablet  Every 6 hours PRN        02/11/24 0101    phenylephrine -shark liver oil-mineral oil-petrolatum (PREPARATION H) 0.25-14-74.9 % rectal ointment  2 times daily PRN        02/11/24 0101    oxyCODONE  (ROXICODONE ) 5 MG immediate release tablet  Every 8 hours PRN        02/11/24 0101    lidocaine  (XYLOCAINE ) 5 % ointment  As needed        02/11/24 0101             Note:  This document was prepared using Dragon voice recognition software and may include unintentional dictation errors.   Naveah Brave, Josette SAILOR, DO 02/11/24 [phone]  "

## 2024-02-11 NOTE — Telephone Encounter (Signed)
 Noted. I see patient was seen today. Ok to keep office appointment as scheduled

## 2024-02-12 ENCOUNTER — Telehealth: Payer: Self-pay

## 2024-02-12 ENCOUNTER — Ambulatory Visit: Admitting: Nurse Practitioner

## 2024-02-12 ENCOUNTER — Emergency Department
Admission: EM | Admit: 2024-02-12 | Discharge: 2024-02-12 | Disposition: A | Discharge status: Home / Self Care | Attending: Emergency Medicine | Admitting: Emergency Medicine

## 2024-02-12 DIAGNOSIS — K644 Residual hemorrhoidal skin tags: Secondary | ICD-10-CM

## 2024-02-12 DIAGNOSIS — K602 Anal fissure, unspecified: Secondary | ICD-10-CM

## 2024-02-12 MED ORDER — NIFEDIPINE 0.3 % OINTMENT: 1.0000 | TOPICAL_OINTMENT | Freq: Four times a day (QID) | CUTANEOUS | 0 refills | Status: DC

## 2024-02-12 MED ORDER — LIDOCAINE HCL URETHRAL/MUCOSAL 2 % EX GEL: 1.0000 | CUTANEOUS | 1 refills | Status: DC | PRN

## 2024-02-12 MED ORDER — LIDOCAINE HCL URETHRAL/MUCOSAL 2 % EX GEL
1.0000 | Freq: Once | CUTANEOUS | Status: AC
  Administered 2024-02-12: 1
  Filled 2024-02-12: qty 6

## 2024-02-12 NOTE — Telephone Encounter (Signed)
 Patient calling back in today wanting to discuss the ED visit he had last night. He states the ED did not properly address his symptoms. Refused internal rectal exam due to no lubricating jelly used by provider. He would like to be seen sooner than his appt tomorrow at noon. He states he has pressure in the back of his head which is a new symptom and not a headache. He states he passed out in a chair, RN confirmed not LOC of syncopal episode and he is referring to sleeping. No LOC, numbness, weakness, slurred or loss of speech, changes in vision. He thinks his voice sounds hoarse today. He would like a call if any sooner appt open up, added to waitlist and advised him the office reaches out via [place] but he states he would like a phone call.

## 2024-02-12 NOTE — Telephone Encounter (Signed)
 Noted. See that he is being evaluated in the ED

## 2024-02-12 NOTE — Telephone Encounter (Signed)
 See other encounter. Patient is in ED

## 2024-02-12 NOTE — Discharge Instructions (Addendum)
 Used topical medications lidocaine  for numbing and nifedipine  for relaxation of the anus to help with your hemorrhoid and anal fissure.  Connect with your general surgeon to talk about hemorrhoidectomy as soon as possible.  Take MiraLAX  1 capful twice daily to help loosen stools and help with bowel movement.  Discontinue all narcotic medications as this can be constipating and worsening your symptoms.  Thank you for choosing us  for your health care today!  Please see your primary doctor this week for a follow up appointment.   If you have any new, worsening, or unexpected symptoms call your doctor right away or come back to the emergency department for reevaluation.  It was my pleasure to care for you today.   Ginnie EDISON Cyrena, MD

## 2024-02-12 NOTE — Telephone Encounter (Signed)
 Next Appt With Family Medicine Roberta Aurora, NP) 02/13/2024 at 12:00 PM

## 2024-02-12 NOTE — Telephone Encounter (Signed)
 Noted

## 2024-02-12 NOTE — Telephone Encounter (Signed)
 Copied from CRM [id number]. Topic: Clinical - Prescription Issue >> Feb 12, 2024  4:54 PM Terri G wrote: Reason for CRM: Patient stated that his insurance is no longer covering some of his medications and that [facility] doesn't cover or carry them and he wants to know if Dr.Cable can prescribe him something that else that will be covered and can help him asap. And if possible if he can send it to the after hours pharmacy . For medications  phenylephrine -shark liver oil-mineral oil-petrolatum (PREPARATION H) 0.25-14-74.9 % rectal ointment, lidocaine  (XYLOCAINE ) 2 % jelly and nifedipine  0.3 % ointment  Callback number [phone]

## 2024-02-12 NOTE — Telephone Encounter (Signed)
 Looks as if patient is in the ED.   Jesus Mason can we get the patient the patient experience number please

## 2024-02-12 NOTE — Telephone Encounter (Signed)
 Noted. Patient to keep current office visit

## 2024-02-12 NOTE — ED Provider Notes (Addendum)
 "  [facility] Provider Note    Event Date/Time   First MD Initiated Contact with Patient 02/12/24 0245     (approximate)   History   Rectal Pain   HPI  Jesus Mason is a 24 y.o. male   Past medical history of anxiety, ADHD, hemorrhoids, here with rectal pain.    Was seen yesterday and diagnosed with hemorrhoid.    Has an upcoming appointment with general surgery that he states to cut out the hemorrhoid   Has ongoing pain.  States that the medications given yesterday were not very much helpful.    No changes in symptoms.     External Medical Documents Reviewed: ED visit from yesterday      Physical Exam   Triage Vital Signs: ED Triage Vitals  Encounter Vitals Group     BP 02/11/24 2330 (!) 161/113     Girls Systolic BP Percentile --      Girls Diastolic BP Percentile --      Boys Systolic BP Percentile --      Boys Diastolic BP Percentile --      Pulse Rate 02/11/24 2330 (!) 116     Resp 02/11/24 2330 18     Temp 02/11/24 2330 98.2 F (36.8 C)     Temp src --      SpO2 02/11/24 2330 99 %     Weight 02/11/24 2330 139 lb 12.4 oz (63.4 kg)     Height 02/11/24 2330 5' 6 (1.676 m)     Head Circumference --      Peak Flow --      Pain Score 02/11/24 2329 10     Pain Loc --      Pain Education --      Exclude from Growth Chart --     Most recent vital signs: Vitals:   02/11/24 2330  BP: (!) 161/113  Pulse: (!) 116  Resp: 18  Temp: 98.2 F (36.8 C)  SpO2: 99%    General: Awake, no distress.  CV:  Good peripheral perfusion.  Resp:  Normal effort.  Abd:  No distention.  Other:  Anxious appearing.  External hemorrhoid nonbleeding.  He defers internal exam stating it was quite painful yesterday and does not allow me to do an internal exam today.   ED Results / Procedures / Treatments   Labs (all labs ordered are listed, but only abnormal results are displayed) Labs Reviewed - No data to  display   PROCEDURES:  Critical Care performed: No  Procedures   MEDICATIONS ORDERED IN ED: Medications  lidocaine  (XYLOCAINE ) 2 % jelly 1 Application (1 Application Other Given 02/12/24 0336)     IMPRESSION / MDM / ASSESSMENT AND PLAN / ED COURSE  I reviewed the triage vital signs and the nursing notes.                              Patient's presentation is most consistent with acute presentation with potential threat to life or bodily function.  Differential diagnosis includes, but is not limited to, rectal fissure, hemorrhoid, prostatitis   MDM:    Patient with an obvious external hemorrhoid that is quite tender, also concern for anal fissure which I cannot see but may very well be a factor as well as his been constipated and pushing hard.  He does not want me to do an internal exam today.  I considered proctitis,  prostatitis, abscesses but without internal exam will not be able to fully properly evaluate for this, but I think much less likely given rectal exam yesterday noted without fluctuance, and normal prostate, and also given a very obvious external hemorrhoid that is painful.  Will give topical medications as he has not tried this yet including lidocaine  and nifedipine .  He has a appointment with general surgery upcoming that I emphasized that he keep.  He wanted more medications to help with pain but I did not think that narcotic pain medications suggested would be helpful at this time given that they may worsen the constipation and subsequent pain.  Discharged with additional medications as above.        FINAL CLINICAL IMPRESSION(S) / ED DIAGNOSES   Final diagnoses:  External hemorrhoid  External hemorrhoid, bleeding  Anal fissure     Rx / DC Orders   ED Discharge Orders          Ordered    nifedipine  0.3 % ointment  4 times daily        02/12/24 0332    lidocaine  (XYLOCAINE ) 2 % jelly  As needed        02/12/24 9666             Note:   This document was prepared using Dragon voice recognition software and may include unintentional dictation errors.    Cyrena Mylar, MD 02/12/24 9480    Cyrena Mylar, MD 02/12/24 [phone]  "

## 2024-02-13 ENCOUNTER — Encounter: Payer: Self-pay | Admitting: General Practice

## 2024-02-13 ENCOUNTER — Ambulatory Visit: Admitting: General Practice

## 2024-02-13 VITALS — BP 124/80 | HR 105 | Temp 98.4°F | Ht 66.0 in | Wt 143.0 lb

## 2024-02-13 DIAGNOSIS — Z09 Encounter for follow-up examination after completed treatment for conditions other than malignant neoplasm: Secondary | ICD-10-CM | POA: Diagnosis not present

## 2024-02-13 DIAGNOSIS — B009 Herpesviral infection, unspecified: Secondary | ICD-10-CM

## 2024-02-13 DIAGNOSIS — K649 Unspecified hemorrhoids: Secondary | ICD-10-CM

## 2024-02-13 DIAGNOSIS — K625 Hemorrhage of anus and rectum: Secondary | ICD-10-CM | POA: Diagnosis not present

## 2024-02-13 MED ORDER — VALACYCLOVIR HCL 1 G PO TABS: 1000.0000 mg | ORAL_TABLET | Freq: Two times a day (BID) | ORAL | 0 refills | Status: AC

## 2024-02-13 NOTE — Progress Notes (Signed)
 "  Established Patient Office Visit  Subjective   Patient ID: Jesus Mason, male    DOB: 2000-09-17  Age: 24 y.o. MRN: [id number]  Chief Complaint  Patient presents with   Hospitalization Follow-up    Needs to discuss medications given and ones that he could not pick up from pharmacy. Patient feels like he was brushed off at the hospital and things werent done when he was there and was not treated well.     HPI  Discussed the use of AI scribe software for clinical note transcription with the patient, who gave verbal consent to proceed.  History of Present Illness Jesus Mason is a 24 year old male with a history of anal fissures and hemorrhoids who presents with rectal pain, bleeding, and hemorrhoid complications.  Approximately a week ago, he experienced an anal fissure following the passage of a hard stool, resulting in bleeding. He has a history of anal fissures and hemorrhoids. After the fissure, his hemorrhoid began to swell, becoming red and warm to the touch. The swelling was accompanied by discharge and odor, leading to further ER visits.  The initial visit was on January 13th, 2026, due to these symptoms. A day and a half later, he developed a discharge with an odor, prompting another ER visit. The pain is severe and constant, and he has been unable to have a successful bowel movement.  He noted increased bleeding following the exam, with bright red blood observed on toilet paper and pads. He has been using stool softeners to manage his symptoms.  He was previously tested positive for HSV in November, although a test in August was negative. He has not had a confirmed outbreak but has been concerned about potential symptoms. He was prescribed Valtrex  for a single day by a mobile unit provider after showing pictures of blisters in his mouth and rectal area. He reports stress during his ER visits, which he believes exacerbated his symptoms.  He has not been  able to use lidocaine  ointment or nifedipine  cream, which he has been unable to obtain due to insurance and pharmacy issues.   He denies any fever, chills, chest pain, shortness of breath or difficulty breathing. No active bleeding today. Denies any dizziness.      Patient Active Problem List   Diagnosis Date Noted   RUQ pain 08/28/2023   Nausea 08/28/2023   High risk homosexual behavior 08/07/2023   Body aches 08/07/2023   Paresthesia 05/09/2023   Hospital discharge follow-up 05/09/2023   Lightheadedness 05/09/2023   Epigastric pain 05/09/2023   Flank pain 12/04/2022   Dysuria 12/04/2022   History of kidney stones 12/04/2022   Pain in left testicle 12/04/2022   Rectal mass 11/25/2022   Encounter for HIV pre-exposure prophylaxis 11/25/2022   Hemorrhoids 10/14/2022   Preventative health care 10/14/2022   GAD (generalized anxiety disorder) 10/14/2022   Concentration deficit 10/14/2022   Past Medical History:  Diagnosis Date   ADHD (attention deficit hyperactivity disorder)    Anxiety    Kidney stones    Renal disorder    kidney stones   Past Surgical History:  Procedure Laterality Date   ADENOIDECTOMY     TONSILLECTOMY     TYMPANOSTOMY TUBE PLACEMENT     Allergies[1]       02/13/2024   12:30 PM 02/11/2024   12:00 PM 08/28/2023    9:07 AM  Depression screen PHQ 2/9  Decreased Interest 3 3 1   Down, Depressed, Hopeless 2  1  PHQ - 2 Score 5 3 2   Altered sleeping 3 3 1   Tired, decreased energy 3 3 3   Change in appetite 3 3 2   Feeling bad or failure about yourself  3 3 3   Trouble concentrating 3 2 0  Moving slowly or fidgety/restless 0 0 0  Suicidal thoughts 0 0 0  PHQ-9 Score 20 17 11    Difficult doing work/chores Extremely dIfficult Very difficult Somewhat difficult     Data saved with a previous flowsheet row definition       02/13/2024   12:30 PM 02/11/2024   12:01 PM 08/28/2023    9:08 AM 05/09/2023   11:23 AM  GAD 7 : Generalized Anxiety Score  Nervous,  Anxious, on Edge 3 3 2 1   Control/stop worrying 3 3 3 1   Worry too much - different things 3 3 3 1   Trouble relaxing 3 3 2 3   Restless 2 3 0 0  Easily annoyed or irritable 3 3 2 1   Afraid - awful might happen 3 3 1  0  Total GAD 7 Score 20 21 13 7   Anxiety Difficulty Extremely difficult Very difficult Somewhat difficult Not difficult at all      Review of Systems  Constitutional:  Negative for chills and fever.  Respiratory:  Negative for shortness of breath.   Cardiovascular:  Negative for chest pain.  Gastrointestinal:  Negative for abdominal pain, constipation, diarrhea, heartburn, nausea and vomiting.  Genitourinary:  Negative for dysuria, frequency and urgency.  Neurological:  Negative for dizziness and headaches.  Endo/Heme/Allergies:  Negative for polydipsia.  Psychiatric/Behavioral:  Negative for depression and suicidal ideas. The patient is not nervous/anxious.       Objective:     BP 124/80   Pulse (!) 105   Temp 98.4 F (36.9 C) (Temporal)   Ht 5' 6 (1.676 m)   Wt 143 lb (64.9 kg)   SpO2 99%   BMI 23.08 kg/m  BP Readings from Last 3 Encounters:  02/13/24 124/80  02/11/24 (!) 161/113  02/11/24 (!) 147/102   Wt Readings from Last 3 Encounters:  02/13/24 143 lb (64.9 kg)  02/11/24 139 lb 12.4 oz (63.4 kg)  02/10/24 139 lb 12.4 oz (63.4 kg)      Physical Exam Vitals and nursing note reviewed.  Constitutional:      Appearance: Normal appearance.  Cardiovascular:     Rate and Rhythm: Normal rate and regular rhythm.     Pulses: Normal pulses.     Heart sounds: Normal heart sounds.  Pulmonary:     Effort: Pulmonary effort is normal.     Breath sounds: Normal breath sounds.  Neurological:     Mental Status: He is alert and oriented to person, place, and time.  Psychiatric:        Mood and Affect: Mood normal.        Behavior: Behavior normal.        Thought Content: Thought content normal.        Judgment: Judgment normal.      No results found  for any visits on 02/13/24.     The ASCVD Risk score (Arnett DK, et al., 2019) failed to calculate for the following reasons:   The 2019 ASCVD risk score is only valid for ages 74 to 41   * - Cholesterol units were assumed    Assessment & Plan:  Hospital discharge follow-up Assessment & Plan: Reviewed hospital notes and mobile clinic notes.   Herpes  simplex infection -     valACYclovir  HCl; Take 1 tablet (1,000 mg total) by mouth 2 (two) times daily for 10 days.  Dispense: 20 tablet; Refill: 0  Rectal bleeding -     CBC -     Comprehensive metabolic panel with GFR  Hemorrhoids, unspecified hemorrhoid type    Assessment and Plan Assessment & Plan Hemorrhoids with rectal bleeding and anal fissure Chronic hemorrhoids with recent exacerbation, possible thrombosis, and anal fissure. Bright red blood suggests superficial bleeding. Previous ER visits inadequate. Urgent referral to gastroenterology and general surgery for evaluation and potential surgery. Nifedipine  ointment effective but not covered by insurance. - Continue stool softeners. - Encourage increased fluid intake. - Advised sitz baths for relief. - Instructed to contact pharmacies for availability of nifedipine  ointment. - Follow up with general surgery on Monday 02/16/24. - Follow up with gastroenterology on January 26th.  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 Positive antibody test in November. Recent exacerbation likely stress-induced. Previous ER visit provided inadequate treatment. - Prescribed Valtrex  1 gram twice daily for 10 days.   Return if symptoms worsen or fail to improve.    Carrol Aurora, NP     [1] No Known Allergies  "

## 2024-02-13 NOTE — Patient Instructions (Addendum)
 Call around to the local pharmacies and find out who has the ointment available. And let me know as soon aspossible so I can send it in.  Start Valtrex  1000 mg twice daily for ten days.  Please start Colace 100 mg 2 capsules in the morning and 2 at bedtime.  Miralax  once daily.  Ensure that you are drinking 64 ounces of water daily.  Increase fiber intake.   Follow up with PCP after you have seen GI and general surgery.   It was a pleasure to see you today!

## 2024-02-13 NOTE — Telephone Encounter (Signed)
 Patient was seen today and was discussed during the visit

## 2024-02-13 NOTE — Telephone Encounter (Signed)
 Pt called to let Carrol know that he found a pharmacy to send medication to. It is [facility] and I added it to pt acct. He states that the pharmacy says they may or may not cover so ask provider to include options for other meds in the event it is too expensive or not covered. Please call pt to discuss further

## 2024-02-13 NOTE — Assessment & Plan Note (Signed)
 Reviewed hospital notes and mobile clinic notes.

## 2024-02-14 LAB — CBC
HCT: 46.6 % (ref 39.4–51.1)
Hemoglobin: 15.2 g/dL (ref 13.2–17.1)
MCH: 29.5 pg (ref 27.0–33.0)
MCHC: 32.6 g/dL (ref 31.6–35.4)
MCV: 90.3 fL (ref 81.4–101.7)
MPV: 11.5 fL (ref 7.5–12.5)
Platelets: 266 Thousand/uL (ref 140–400)
RBC: 5.16 Million/uL (ref 4.20–5.80)
RDW: 13 % (ref 11.0–15.0)
WBC: 9 Thousand/uL (ref 3.8–10.8)

## 2024-02-14 LAB — COMPREHENSIVE METABOLIC PANEL WITH GFR
AG Ratio: 2 (calc) (ref 1.0–2.5)
ALT: 10 U/L (ref 9–46)
AST: 14 U/L (ref 10–40)
Albumin: 4.5 g/dL (ref 3.6–5.1)
Alkaline phosphatase (APISO): 64 U/L (ref 36–130)
BUN: 10 mg/dL (ref 7–25)
CO2: 28 mmol/L (ref 20–32)
Calcium: 9.5 mg/dL (ref 8.6–10.3)
Chloride: 103 mmol/L (ref 98–110)
Creat: 0.73 mg/dL (ref 0.60–1.24)
Globulin: 2.3 g/dL (ref 1.9–3.7)
Glucose, Bld: 82 mg/dL (ref 65–99)
Potassium: 4.4 mmol/L (ref 3.5–5.3)
Sodium: 143 mmol/L (ref 135–146)
Total Bilirubin: 0.5 mg/dL (ref 0.2–1.2)
Total Protein: 6.8 g/dL (ref 6.1–8.1)
eGFR: 131 mL/min/1.73m2

## 2024-02-15 ENCOUNTER — Encounter: Payer: Self-pay | Admitting: General Practice

## 2024-02-15 DIAGNOSIS — K602 Anal fissure, unspecified: Secondary | ICD-10-CM

## 2024-02-16 ENCOUNTER — Telehealth: Payer: Self-pay

## 2024-02-16 ENCOUNTER — Ambulatory Visit: Payer: Self-pay | Admitting: General Practice

## 2024-02-16 ENCOUNTER — Ambulatory Visit: Payer: Self-pay | Admitting: Surgery

## 2024-02-16 ENCOUNTER — Encounter: Payer: Self-pay | Admitting: Surgery

## 2024-02-16 VITALS — BP 122/90 | HR 98 | Ht 66.0 in | Wt 146.0 lb

## 2024-02-16 DIAGNOSIS — K602 Anal fissure, unspecified: Secondary | ICD-10-CM | POA: Diagnosis not present

## 2024-02-16 DIAGNOSIS — K644 Residual hemorrhoidal skin tags: Secondary | ICD-10-CM

## 2024-02-16 MED ORDER — NIFEDIPINE 0.3 % OINTMENT: 1.0000 | TOPICAL_OINTMENT | Freq: Four times a day (QID) | CUTANEOUS | 0 refills | Status: AC

## 2024-02-16 NOTE — Progress Notes (Signed)
 Patient ID: Jesus Mason, male   DOB: 2000/05/18, 24 y.o.   MRN: [id number]  HPI Jesus Mason is a 24 y.o. male seen in consultation at the request of Mrs. Vincente. He describes symptoms have been present for the last couple weeks, he has been having significant anorectal pain that is intermittent sharp and moderate to severe in intensity.  Experiences intermittent bleeding.  Symptoms are usually exacerbated when passing the stool. He has been to the emergency room 3 times. He has tried numerous cream including Preparation H steroids and nifedipine .  He could not feel his nifedipine  as Medicaid did not cover this. He was also Herpes simplex V2 positive, HIV neg. CBC and CMP unremarkable. 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 HPI  Past Medical History:  Diagnosis Date   ADHD (attention deficit hyperactivity disorder)    Anxiety    Kidney stones    Renal disorder    kidney stones    Past Surgical History:  Procedure Laterality Date   ADENOIDECTOMY     TONSILLECTOMY     TYMPANOSTOMY TUBE PLACEMENT      Family History  Problem Relation Age of Onset   Breast cancer Maternal Grandmother    Diabetes Paternal Grandmother    Diabetes Paternal Grandfather     Social History Social History[1]  Allergies[2]  Current Outpatient Medications  Medication Sig Dispense Refill   busPIRone  (BUSPAR ) 5 MG tablet Take 1 tablet (5 mg total) by mouth 3 (three) times daily. 90 tablet 5   DESCOVY 200-25 MG tablet Take 1 tablet by mouth daily.     dibucaine (NUPERCAINAL) 1 % OINT Place 1 Application rectally 3 (three) times daily as needed. 30 g 0   docusate sodium  (COLACE) 100 MG capsule Take 1 capsule (100 mg total) by mouth 2 (two)  times daily. 60 capsule 0   fluticasone  (FLONASE ) 50 MCG/ACT nasal spray Place 2 sprays into both nostrils daily. 16 g 0   hydrocortisone  (ANUSOL -HC) 25 MG suppository Place 1 suppository (25 mg total) rectally every 12 (twelve) hours. 12 suppository 1   meclizine  (ANTIVERT ) 12.5 MG tablet Take 1 tablet (12.5 mg total) by mouth 3 (three) times daily as needed for dizziness. 30 tablet 0   nifedipine  0.3 % ointment Place 1 Application rectally 4 (four) times daily. 30 g 0   omeprazole  (PRILOSEC) 40 MG capsule Take 1 capsule (40 mg total) by mouth daily. 30 capsule 1   ondansetron  (ZOFRAN -ODT) 4 MG disintegrating tablet Take 1 tablet (4 mg total) by mouth every 6 (six) hours as needed for nausea or vomiting. 20 tablet 0   senna-docusate (SENOKOT-S) 8.6-50 MG tablet Take 1 tablet by mouth daily. 30 tablet 0   tamsulosin  (FLOMAX ) 0.4 MG CAPS capsule Take 1 capsule (0.4 mg total) by mouth daily. 30 capsule 0   valACYclovir  (VALTREX ) 1000 MG tablet Take 1 tablet (1,000 mg total) by mouth 2 (two) times daily for 10 days. 20 tablet 0   No current facility-administered medications for this visit.     Review of Systems Full ROS  was asked and was negative except for the information on the HPI  Physical Exam Blood pressure (!) 122/90, pulse 98, height 5' 6 (1.676 m), weight 146 lb (66.2 kg),  SpO2 97%. CONSTITUTIONAL: NAD.  Chaperone present EYES: Pupils are equal, round, Sclera are non-icteric. RESPIRATORY:  Lungs are clear. There is normal respiratory effort, with equal breath sounds bilaterally, and without pathologic use of accessory muscles. CARDIOVASCULAR: Heart is regular without murmurs, gallops, or rubs. GI: The abdomen is  soft, nontender, and nondistended. There are no palpable masses. There is no hepatosplenomegaly. There are normal bowel sounds in all quadrants. Rectal: There is evidence of posterior midline fissure.  There is also an associated external hemorrhoid it is tender to  palpation.  No obvious rectal masses no evidence of infection MUSCULOSKELETAL: Normal muscle strength and tone. No cyanosis or edema.   SKIN: Turgor is good and there are no pathologic skin lesions or ulcers. NEUROLOGIC: Motor and sensation is grossly normal. Cranial nerves are grossly intact. PSYCH:  Oriented to person, place and time. Affect is normal.  Data Reviewed I have personally reviewed the patient's imaging, laboratory findings and medical records.    Assessment/Plan 24 year old male with both anal fissure and external hemorrhoids are symptomatic.  Discussed with the patient in detail about his disease process.  I do think that the first thing to do is to improve her fissure.  Will prescribe nifedipine  cream with lidocaine .  We talked to him about sitz bath's incorporation of fiber and appropriate hygiene.  We also talked about potential chemical sphincterotomy with Botox.  Regarding external hemorrhoids I am not inclined to do anything at this time as he will worsen his anal rectal pain. I Will be happy to see him in a few weeks I personally spent a total of 45 minutes in the care of the patient today including performing a medically appropriate exam/evaluation, counseling and educating, placing orders, referring and communicating with other health care professionals, documenting clinical information in the EHR, independently interpreting and reviewing images studies and coordinating care.    Laneta Luna, MD FACS General Surgeon 02/16/2024, 10:56 AM      [1]  Social History Tobacco Use   Smoking status: Every Day    Types: Cigars, Cigarettes    Passive exposure: Past   Smokeless tobacco: Never   Tobacco comments:    Occasional cigar.   Vaping Use   Vaping status: Every Day   Substances: Nicotine, Flavoring  Substance Use Topics   Alcohol use: Yes    Comment: occasional   Drug use: Yes    Types: Marijuana  [2] No Known Allergies

## 2024-02-16 NOTE — Patient Instructions (Addendum)
 Your prescription for Nifedipine  ointment has been called into [facility] in [place]. This is a compounded medication and they are the only pharmacy that does this. Insurance does not normally cover this medication and the cost is $56. Apply a small amount to your finger and place this just inside the rectum three times a day for 6 weeks.  The drug store will call you to let you know when this prescription is ready. [facility] is located at [address] [place], [place] [address] [facility]: [phone]   You may take fiber supplements twice a day like Metamucil/Benefiber, generic is good. Be sure to drink plenty of water with this.   We will have you follow up here in 3 weeks for a recheck. If the Nifedipine  treatment does not work then we will need to speak about what the next steps may be.   How to Take a Sitz Bath-2 times a day, especially after bowel movements.  A sitz bath is a warm water bath that may be used to care for your rectum, genital area, or the area between your rectum and genitals (perineum). In a sitz bath, the water only comes up to your hips and covers your buttocks. A sitz bath may be done in a bathtub or with a portable sitz bath that fits over the toilet. Your health care provider may recommend a sitz bath to help: Relieve pain and discomfort after delivering a baby. Relieve pain and itching from hemorrhoids or anal fissures. Relieve pain after certain surgeries. Relax muscles that are sore or tight. How to take a sitz bath Take 2-4 sitz baths a day, or as many as told by your health care provider. Bathtub sitz bath To take a sitz bath in a bathtub: Partially fill a bathtub with warm water. The water should be deep enough to cover your hips and buttocks when you are sitting in the bathtub. Follow your health care provider's instructions if you are told to put medicine in the water. Sit in the water. Open the bathtub drain a little, and leave it open during your  bath. Turn on the warm water again, enough to replace the water that is draining out. Keep the water running throughout your bath. This helps keep the water at the right level and temperature. Soak in the water for 15-20 minutes, or as long as told by your health care provider. When you are done, be careful when you stand up. You may feel dizzy. After the sitz bath, pat yourself dry. Do not rub your skin to dry it.  Over-the-toilet sitz bath To take a sitz bath with an over-the-toilet basin: Follow the manufacturer's instructions. Fill the basin with warm water. Follow your health care provider's instructions if you were told to put medicine in the water. Sit on the seat. Make sure the water covers your buttocks and perineum. Soak in the water for 15-20 minutes, or as long as told by your health care provider. After the sitz bath, pat yourself dry. Do not rub your skin to dry it. Clean and dry the basin between uses. Discard the basin if it cracks, or according to the manufacturer's instructions.  Contact a health care provider if: Your pain or itching gets worse. Stop doing sitz baths if your symptoms get worse. You have new symptoms. Stop doing sitz baths until you talk with your health care provider. Summary A sitz bath is a warm water bath in which the water only comes up to your  hips and covers your buttocks. Your health care provider may recommend a sitz bath to help relieve pain and discomfort after delivering a baby, relieve pain and itching from hemorrhoids or anal fissures, relieve pain after certain surgeries, or help to relax muscles that are sore or tight. Take 2-4 sitz baths a day, or as many as told by your health care provider. Soak in the water for 15-20 minutes. Stop doing sitz baths if your symptoms get worse. This information is not intended to replace advice given to you by your health care provider. Make sure you discuss any questions you have with your health care  provider. Document Revised: 04/17/2021 Document Reviewed: 04/17/2021 Elsevier Patient Education  2024 Elsevier Inc.  Fiber Content in Foods Fiber is found in plant foods, such as fruits, vegetables, whole grains, nuts, seeds, and beans. If you have certain conditions, you may need to eat a high-fiber diet or a low-fiber diet. Your health care provider will tell you how much fiber you need. If you have problems or questions, contact your provider or dietitian. What foods are high in fiber?  Foods high in fiber have 4g of fiber or more per serving. Fruits Blackberries or raspberries (fresh) --  cup (75 g) has 4 g of fiber. Pear (fresh) -- 1 medium (180 g) has 5.5 g of fiber. Prunes (dried) -- 6 to 8 pieces (57-76 g) has 5 g of fiber. Apple with skin -- 1 medium (182 g) has 4.8 g of fiber. Guava -- 1 cup (128 g) has 8.9 g of fiber. Vegetables Peas (frozen) --  cup (80 g) has 4.4 g of fiber. Potato with skin (baked) -- 1 medium (173 g) has 4 g of fiber. Pumpkin (canned) --  cup (122 g) has 4 g of fiber. Sweet potato --  cup mashed (124 g) has 4 g of fiber. Winter squash -- 1 cup cooked (205 g) has 5.7 g of fiber. Grains Bran cereal --  cup (31 g) has 8.6 g of fiber. Bulgur (cooked) --  cup (70 g) has 4 g of fiber. Quinoa (cooked) -- 1 cup (185 g) has 5.2 g of fiber. Popcorn -- 3 cups (375 g) popped has 5.8 g of fiber. Spaghetti, whole wheat -- 1 cup (140 g) has 6 g of fiber. Oatmeal (cooked) -- 1 cup (234 g) has 4g of fiber. Meats and other proteins Pinto beans (cooked) --  cup (90 g) has 7.7 g of fiber. Lentils (cooked) --  cup (90 g) has 7.8 g of fiber. Kidney beans (canned) --  cup (92.5 g) has 5.7 g of fiber. Soybeans (canned, frozen, or fresh) --  cup (92.5 g) has 5.2 g of fiber. Baked beans, plain or vegetarian (canned) --  cup (130 g) has 5.2 g of fiber. Garbanzo beans or chickpeas (canned) --  cup (90 g) has 6.6 g of fiber. Black beans (cooked) --  cup (86 g) has  7.5 g of fiber. White beans or navy beans (cooked) --  cup (91 g) has 9.3 g of fiber. The items listed above may not be a complete list of foods with high fiber. Actual amounts of fiber may be different depending on processing. Contact a dietitian for more information. What foods are moderate in fiber?  Moderate fiber foods have 1-3 g of fiber per serving. Fruits Banana -- 1 medium (126 g) has 3.2 g of fiber. Melon -- 1 cup (155 g) has 1.4 g of fiber. Orange -- 1 small (154  g) has 3.7 g of fiber. Raisins --  cup (40 g) has 1.8 g of fiber. Applesauce, sweetened --  cup (125 g) has 1.5 g of fiber. Blueberries (fresh) --  cup (75 g) has 1.8 g of fiber. Strawberries (fresh, sliced) -- 1 cup (150 g) has 3 g of fiber. Cherries -- 1 cup (140 g) has 2.9 g of fiber. Vegetables Broccoli (cooked) --  cup (77.5 g) has 2.1 g of fiber. Brussels sprouts (cooked) --  cup (78 g) has 3 g of fiber. Carrots (cooked) --  cup (77.5 g) has 2.2 g of fiber. Corn (canned or frozen) --  cup (82.5 g) has 2.1 g of fiber. Potatoes, mashed --  cup (105 g) has 1.6 g of fiber. Tomato -- 1 medium (62 g) has 1.5 g of fiber. Green beans (canned) --  cup (83 g) has 2 g of fiber. Sweet potato, baked -- 1 medium (150 g) has 3 g of fiber. Cauliflower (cooked) -- 1/2 cup (90 g) has 2.3 g of fiber. Grains Long-grain brown rice (cooked) -- 1 cup (196 g) has 3.5 g of fiber. Bagel, plain -- one 4-inch (10 cm) bagel has 2 g of fiber. Instant oatmeal --  cup (120 g) has about 2 g of fiber. Macaroni noodles, enriched (cooked) -- 1 cup (140 g) has 2.5 g of fiber. Multigrain cereal --  cup (15 g) has about 2-4 g of fiber. Whole-wheat bread -- 1 slice (26 g) has 2 g of fiber. Whole-wheat spaghetti noodles --  cup (70 g) has 3.2 g of fiber. Corn tortilla -- one 6-inch (15 cm) tortilla has 1.5 g of fiber. Meats and other proteins Almonds --  cup or 1 oz (28 g) has 3.5 g of fiber. Sunflower seeds in shell --  cup or   oz (11.5 g) has 1.1 g of fiber. Vegetable or soy patty -- 1 patty (70 g) has 3.4 g of fiber. Walnuts --  cup or 1 oz (30 g) has 2 g of fiber. Flax seed -- 1 Tbsp (7 g) has 2.8 g of fiber. The items listed above may not be a complete list of foods with moderate amounts of fiber. Actual amounts of fiber may be different depending on processing. Contact a dietitian for more information. What foods are low in fiber?  Low-fiber foods contain less than 1 g of fiber per serving. They include: Fruits Fruit juice --  cup or 4 fl oz (118 mL) has 0.5 g of fiber. Vegetables Lettuce -- 1 cup (35 g) has 0.5 g of fiber. Cucumber (slices) --  cup (60 g) has 0.3 g of fiber. Celery -- 1 stalk (40 g) has 0.1 g of fiber. Grains Flour tortilla -- one 6-inch (15 cm) tortilla has 0.5 g of fiber. White rice (cooked) --  cup (81.5 g) has 0.3 g of fiber. Meats and other proteins Egg -- 1 large (50 g) has 0 g of fiber. Meat, poultry, or fish -- 3 oz (85 g) has 0 g of fiber. Dairy Milk -- 1 cup or 8 fl oz (237 mL) has 0 g of fiber. Yogurt -- 1 cup (245 g) has 0 g of fiber. The items listed above may not be a complete list of foods that are low in fiber. Actual amounts of fiber may be different depending on processing. Contact a dietitian for more information. This information is not intended to replace advice given to you by your health care provider. Make sure you  discuss any questions you have with your health care provider. Document Revised: 04/08/2022 Document Reviewed: 04/08/2022 Elsevier Patient Education  2024 [employer].

## 2024-02-16 NOTE — Telephone Encounter (Signed)
Thank you.    Jesus Mason

## 2024-02-16 NOTE — Telephone Encounter (Signed)
 The patient called requesting confirmation of his appointment date, stating he was advised that an appointment had already been scheduled with a GI provider. He was informed that he would need to speak with someone at [facility] for further clarification, and the call was transferred to [facility].

## 2024-02-23 ENCOUNTER — Ambulatory Visit: Payer: Self-pay | Admitting: Surgery

## 2024-03-02 ENCOUNTER — Encounter: Admitting: Nurse Practitioner

## 2024-03-10 ENCOUNTER — Ambulatory Visit: Admitting: Surgery

## 2024-04-08 ENCOUNTER — Encounter: Admitting: Nurse Practitioner

## 2024-04-12 ENCOUNTER — Encounter: Admitting: Nurse Practitioner
